# Patient Record
Sex: Male | Born: 1995 | Race: White | Hispanic: No | Marital: Single | Smoking: Former smoker
Health system: Southern US, Community
[De-identification: ages and names within clinical notes are randomized; demographics above are authoritative.]

## PROBLEM LIST (undated history)

## (undated) DIAGNOSIS — F419 Anxiety disorder, unspecified: Secondary | ICD-10-CM

## (undated) DIAGNOSIS — K219 Gastro-esophageal reflux disease without esophagitis: Secondary | ICD-10-CM

---
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
## 2015-11-11 ENCOUNTER — Emergency Department (INDEPENDENT_AMBULATORY_CARE_PROVIDER_SITE_OTHER)
Admission: EM | Admit: 2015-11-11 | Discharge: 2015-11-11 | Disposition: A | Discharge status: Home / Self Care | Payer: BLUE CROSS/BLUE SHIELD | Source: Home / Self Care | Attending: Family Medicine | Admitting: Family Medicine

## 2015-11-11 ENCOUNTER — Encounter: Payer: Self-pay | Admitting: *Deleted

## 2015-11-11 DIAGNOSIS — J029 Acute pharyngitis, unspecified: Secondary | ICD-10-CM | POA: Diagnosis not present

## 2015-11-11 LAB — POCT RAPID STREP A (OFFICE): Rapid Strep A Screen: NEGATIVE

## 2015-11-11 MED ORDER — PENICILLIN V POTASSIUM 500 MG PO TABS: ORAL_TABLET | ORAL | Status: DC

## 2015-11-11 NOTE — ED Notes (Addendum)
Pt c/o sore throat, swollen tonsils, pain and difficulty swallowing x 5 days. No fever, taken aka seltzer and tylenol otc. Girlfriend + for strep today

## 2015-11-11 NOTE — ED Provider Notes (Signed)
CSN: [id number]     Arrival date & time 11/11/15  1143 History   First MD Initiated Contact with Patient 11/11/15 1252     Chief Complaint  Patient presents with  . Sore Throat      HPI Comments: Patient developed a sore throat 5 days ago that has persisted.  He has had a mild cough but no sinus congestion, and feels hot/cold.  His girlfriend has confirmed strep pharyngitis.  The history is provided by the patient.    History reviewed. No pertinent past medical history. History reviewed. No pertinent past surgical history. History reviewed. No pertinent family history. Social History  Substance Use Topics  . Smoking status: Current Every Day Smoker -- 1.00 packs/day    Types: Cigarettes  . Smokeless tobacco: Never Used  . Alcohol Use: Yes    Review of Systems + sore throat + cough No pleuritic pain No wheezing No nasal congestion ? post-nasal drainage No sinus pain/pressure No itchy/red eyes No earache No hemoptysis No SOB No fever, + chills No nausea No vomiting No abdominal pain No diarrhea No urinary symptoms No skin rash + fatigue No myalgias No headache Used OTC meds without relief  Allergies  Review of patient's allergies indicates no known allergies.  Home Medications   Prior to Admission medications   Medication Sig Start Date End Date Taking? Authorizing Provider  penicillin v potassium (VEETID) 500 MG tablet Take one tab by mouth twice daily for 10 days 11/11/15   Lattie HawStephen A Layal Javid, MD   Meds Ordered and Administered this Visit  Medications - No data to display  BP 129/81 mmHg  Pulse 52  Temp(Src) 98.3 F (36.8 C) (Oral)  Ht 6' (1.829 m)  Wt 141 lb (63.957 kg)  BMI 19.12 kg/m2  SpO2 98% No data found.   Physical Exam Nursing notes and Vital Signs reviewed. Appearance:  Patient appears stated age, and in no acute distress Eyes:  Pupils are equal, round, and reactive to light and accomodation.  Extraocular movement is intact.  Conjunctivae  are not inflamed  Ears:  Canals normal.  Tympanic membranes normal.  Nose:  Mildly congested turbinates.  No sinus tenderness.   Pharynx:  Erythematous Neck:  Supple.  Tender enlarged tonsillar nodes bilaterally Lungs:  Clear to auscultation.  Breath sounds are equal.  Moving air well. Heart:  Regular rate and rhythm without murmurs, rubs, or gallops.  Abdomen:  Nontender without masses or hepatosplenomegaly.  Bowel sounds are present.  No CVA or flank tenderness.  Extremities:  No edema.  Skin:  No rash present.   ED Course  Procedures none    Labs Reviewed  POCT RAPID STREP A (OFFICE) negative      MDM   1. Acute pharyngitis, unspecified etiology; ?false negative rapid strep test.    Throat culture pending. Begin empiric PenVK Try warm salt water gargles for sore throat.    May take Ibuprofen 200mg , 4 tabs every 8 hours with food for sore throat.   Follow-up with family doctor if not improving about10 days.     Lattie HawStephen A Tarini Carrier, MD 11/11/15 1304

## 2015-11-11 NOTE — Discharge Instructions (Signed)
Try warm salt water gargles for sore throat.    May take Ibuprofen 200mg , 4 tabs every 8 hours with food for sore throat.   Follow-up with family doctor if not improving about10 days.    Strep Throat Strep throat is a bacterial infection of the throat. Your health care provider may call the infection tonsillitis or pharyngitis, depending on whether there is swelling in the tonsils or at the back of the throat. Strep throat is most common during the cold months of the year in children who are 435-21 years of age, but it can happen during any season in people of any age. This infection is spread from person to person (contagious) through coughing, sneezing, or close contact. CAUSES Strep throat is caused by the bacteria called Streptococcus pyogenes. RISK FACTORS This condition is more likely to develop in:  People who spend time in crowded places where the infection can spread easily.  People who have close contact with someone who has strep throat. SYMPTOMS Symptoms of this condition include:  Fever or chills.   Redness, swelling, or pain in the tonsils or throat.  Pain or difficulty when swallowing.  White or yellow spots on the tonsils or throat.  Swollen, tender glands in the neck or under the jaw.  Red rash all over the body (rare). DIAGNOSIS This condition is diagnosed by performing a rapid strep test or by taking a swab of your throat (throat culture test). Results from a rapid strep test are usually ready in a few minutes, but throat culture test results are available after one or two days. TREATMENT This condition is treated with antibiotic medicine. HOME CARE INSTRUCTIONS Medicines  Take over-the-counter and prescription medicines only as told by your health care provider.  Take your antibiotic as told by your health care provider. Do not stop taking the antibiotic even if you start to feel better.  Have family members who also have a sore throat or fever tested for  strep throat. They may need antibiotics if they have the strep infection. Eating and Drinking  Do not share food, drinking cups, or personal items that could cause the infection to spread to other people.  If swallowing is difficult, try eating soft foods until your sore throat feels better.  Drink enough fluid to keep your urine clear or pale yellow. General Instructions  Gargle with a salt-water mixture 3-4 times per day or as needed. To make a salt-water mixture, completely dissolve -1 tsp of salt in 1 cup of warm water.  Make sure that all household members wash their hands well.  Get plenty of rest.  Stay home from school or work until you have been taking antibiotics for 24 hours.  Keep all follow-up visits as told by your health care provider. This is important. SEEK MEDICAL CARE IF:  The glands in your neck continue to get bigger.  You develop a rash, cough, or earache.  You cough up a thick liquid that is green, yellow-brown, or bloody.  You have pain or discomfort that does not get better with medicine.  Your problems seem to be getting worse rather than better.  You have a fever. SEEK IMMEDIATE MEDICAL CARE IF:  You have new symptoms, such as vomiting, severe headache, stiff or painful neck, chest pain, or shortness of breath.  You have severe throat pain, drooling, or changes in your voice.  You have swelling of the neck, or the skin on the neck becomes red and tender.  You  have signs of dehydration, such as fatigue, dry mouth, and decreased urination.  You become increasingly sleepy, or you cannot wake up completely.  Your joints become red or painful.   This information is not intended to replace advice given to you by your health care provider. Make sure you discuss any questions you have with your health care provider.   Document Released: 06/05/2000 Document Revised: 02/27/2015 Document Reviewed: 10/01/2014 Elsevier Interactive Patient Education  [employer]

## 2015-11-12 ENCOUNTER — Telehealth: Payer: Self-pay | Admitting: *Deleted

## 2015-11-12 LAB — STREP A DNA PROBE: GASP: NOT DETECTED

## 2015-11-12 NOTE — ED Notes (Signed)
Callback: Pt given negative TCX results.

## 2016-03-22 ENCOUNTER — Emergency Department (INDEPENDENT_AMBULATORY_CARE_PROVIDER_SITE_OTHER)
Admission: EM | Admit: 2016-03-22 | Discharge: 2016-03-22 | Disposition: A | Discharge status: Home / Self Care | Payer: BLUE CROSS/BLUE SHIELD | Source: Home / Self Care | Attending: Family Medicine | Admitting: Family Medicine

## 2016-03-22 ENCOUNTER — Encounter: Payer: Self-pay | Admitting: Emergency Medicine

## 2016-03-22 DIAGNOSIS — Z23 Encounter for immunization: Secondary | ICD-10-CM | POA: Diagnosis not present

## 2016-03-22 DIAGNOSIS — T63461A Toxic effect of venom of wasps, accidental (unintentional), initial encounter: Secondary | ICD-10-CM

## 2016-03-22 MED ORDER — TETANUS-DIPHTH-ACELL PERTUSSIS 5-2.5-18.5 LF-MCG/0.5 IM SUSP
0.5000 mL | Freq: Once | INTRAMUSCULAR | Status: AC
  Administered 2016-03-22: 0.5 mL via INTRAMUSCULAR

## 2016-03-22 MED ORDER — CEPHALEXIN 500 MG PO CAPS
500.0000 mg | ORAL_CAPSULE | Freq: Two times a day (BID) | ORAL | 0 refills | Status: DC
Start: 2016-03-22 — End: 2016-06-25

## 2016-03-22 MED ORDER — PREDNISONE 50 MG PO TABS: ORAL_TABLET | ORAL | 0 refills | Status: DC

## 2016-03-22 NOTE — ED Triage Notes (Signed)
Yesterday patient was stung 7 times by yellow jackets: both lower legs, scalp and neck. No known allergies to stings. No known Tdap since age 20. Urged to come by family members; took benadryl last night and ibuprofen this morning.

## 2016-03-22 NOTE — ED Provider Notes (Signed)
[facility]    CSN: [id number] Arrival date & time: 03/22/16  1316     History   Chief Complaint Chief Complaint  Patient presents with  . Insect Bite    HPI Corey Peters is a 20 y.o. male.   Yesterday patient was stung 7 times by yellow jackets on his lower legs, scalp, and neck.  No shortness of breath, wheezing, or difficulty swallowing.  He has developed increasing redness and swelling at the sting sites.  His Tdap is not current.   The history is provided by the patient.    History reviewed. No pertinent past medical history.  There are no active problems to display for this patient.   History reviewed. No pertinent surgical history.     Home Medications    Prior to Admission medications   Medication Sig Start Date End Date Taking? Authorizing Provider  cephALEXin (KEFLEX) 500 MG capsule Take 1 capsule (500 mg total) by mouth 2 (two) times daily. 03/22/16   Lattie Haw, MD  penicillin v potassium (VEETID) 500 MG tablet Take one tab by mouth twice daily for 10 days 11/11/15   Lattie Haw, MD  predniSONE (DELTASONE) 50 MG tablet Take one tab by mouth with food once daily for four days 03/22/16   Lattie Haw, MD    Family History History reviewed. No pertinent family history.  Social History Social History  Substance Use Topics  . Smoking status: Current Every Day Smoker    Packs/day: 1.00    Types: Cigarettes  . Smokeless tobacco: Never Used  . Alcohol use Yes     Allergies   Review of patient's allergies indicates no known allergies.   Review of Systems Review of Systems  Constitutional: Negative for chills, diaphoresis, fatigue and fever.  HENT: Negative.   Eyes: Negative.   Respiratory: Negative.   Cardiovascular: Positive for leg swelling. Negative for chest pain.  Gastrointestinal: Negative.   Genitourinary: Negative.   Musculoskeletal: Negative.   Skin: Positive for rash.  Neurological: Negative for headaches.      Physical Exam Triage Vital Signs ED Triage Vitals  Enc Vitals Group     BP 03/22/16 1334 113/65     Pulse Rate 03/22/16 1334 (!) 56     Resp 03/22/16 1334 16     Temp 03/22/16 1334 98 F (36.7 C)     Temp Source 03/22/16 1334 Oral     SpO2 03/22/16 1334 98 %     Weight 03/22/16 1334 140 lb (63.5 kg)     Height 03/22/16 1334 6\' 1"  (1.854 m)     Head Circumference --      Peak Flow --      Pain Score 03/22/16 1336 1     Pain Loc --      Pain Edu? --      Excl. in GC? --    No data found.   Updated Vital Signs BP 113/65 (BP Location: Left Arm)   Pulse (!) 56   Temp 98 F (36.7 C) (Oral)   Resp 16   Ht 6\' 1"  (1.854 m)   Wt 140 lb (63.5 kg)   SpO2 98%   BMI 18.47 kg/m   Visual Acuity Right Eye Distance:   Left Eye Distance:   Bilateral Distance:    Right Eye Near:   Left Eye Near:    Bilateral Near:     Physical Exam  Constitutional: He appears well-developed and well-nourished. No distress.  HENT:  Head: Atraumatic.  Right Ear: External ear normal.  Left Ear: External ear normal.  Nose: Nose normal.  Mouth/Throat: Oropharynx is clear and moist.  Eyes: Conjunctivae are normal. Pupils are equal, round, and reactive to light.  Neck: Neck supple.  Cardiovascular: Normal heart sounds.   Pulmonary/Chest: Breath sounds normal.  Abdominal: There is no tenderness.  Lymphadenopathy:    He has no cervical adenopathy.  Neurological: He is alert.  Skin: There is erythema.     There are scattered insect stings on neck and lower extremities.  Right lower leg is mildly swollen and warm, but no calf tenderness present.  Nursing note and vitals reviewed.    UC Treatments / Results  Labs (all labs ordered are listed, but only abnormal results are displayed) Labs Reviewed - No data to display  EKG  EKG Interpretation None       Radiology No results found.  Procedures Procedures (including critical care time)  Medications Ordered in UC Medications   Tdap (BOOSTRIX) injection 0.5 mL (0.5 mLs Intramuscular Given 03/22/16 1337)     Initial Impression / Assessment and Plan / UC Course  I have reviewed the triage vital signs and the nursing notes.  Pertinent labs & imaging results that were available during my care of the patient were reviewed by me and considered in my medical decision making (see chart for details).  Clinical Course  Administered Tdap  Begin prednisone burst, and empiric Keflex 500mg  BID May take non-sedating antihistamine such as Zyrtec 10mg  daily for itching and rash. Followup with Family Doctor if not improved in one week.      Final Clinical Impressions(s) / UC Diagnoses   Final diagnoses:  Yellow jacket sting, accidental or unintentional, initial encounter    New Prescriptions New Prescriptions   CEPHALEXIN (KEFLEX) 500 MG CAPSULE    Take 1 capsule (500 mg total) by mouth 2 (two) times daily.   PREDNISONE (DELTASONE) 50 MG TABLET    Take one tab by mouth with food once daily for four days     Lattie HawStephen A Rogerio Boutelle, MD 03/23/16 [phone]

## 2016-03-22 NOTE — Discharge Instructions (Signed)
May take non-sedating antihistamine such as Zyrtec 10mg  daily for itching and rash.

## 2016-06-25 ENCOUNTER — Emergency Department (INDEPENDENT_AMBULATORY_CARE_PROVIDER_SITE_OTHER)
Admission: EM | Admit: 2016-06-25 | Discharge: 2016-06-25 | Disposition: A | Discharge status: Home / Self Care | Payer: BLUE CROSS/BLUE SHIELD | Source: Home / Self Care | Attending: Family Medicine | Admitting: Family Medicine

## 2016-06-25 ENCOUNTER — Encounter: Payer: Self-pay | Admitting: Emergency Medicine

## 2016-06-25 DIAGNOSIS — R197 Diarrhea, unspecified: Secondary | ICD-10-CM | POA: Diagnosis not present

## 2016-06-25 DIAGNOSIS — R109 Unspecified abdominal pain: Secondary | ICD-10-CM

## 2016-06-25 DIAGNOSIS — R112 Nausea with vomiting, unspecified: Secondary | ICD-10-CM

## 2016-06-25 MED ORDER — PROMETHAZINE HCL 25 MG PO TABS: 25.0000 mg | ORAL_TABLET | Freq: Four times a day (QID) | ORAL | 0 refills | Status: DC | PRN

## 2016-06-25 NOTE — ED Triage Notes (Signed)
Emesis started last night, diarrhea, gave 4mg  zofran and gingerale at 2:39

## 2016-06-25 NOTE — ED Provider Notes (Signed)
CSN: [id number]     Arrival date & time 06/25/16  1423 History   First MD Initiated Contact with Patient 06/25/16 1438     Chief Complaint  Patient presents with  . Emesis   (Consider location/radiation/quality/duration/timing/severity/associated sxs/prior Treatment) HPI Corey Peters is a 21 y.o. male presenting to UC with c/o nausea, vomiting, and diarrhea that started late last night.  He reports vomiting about 5-6 times and having 2-3 episodes of diarrhea.  Mild diffuse abdominal cramping.  He has not tried anything for his symptoms yet. Denies fever, chills, congestion or cough. No known sick contacts or recent travel.     History reviewed. No pertinent past medical history. History reviewed. No pertinent surgical history. No family history on file. Social History  Substance Use Topics  . Smoking status: Current Every Day Smoker    Packs/day: 1.00    Types: Cigarettes  . Smokeless tobacco: Never Used  . Alcohol use Yes    Review of Systems  Constitutional: Negative for chills and fever.  HENT: Negative for congestion, ear pain, sore throat, trouble swallowing and voice change.   Respiratory: Negative for cough and shortness of breath.   Cardiovascular: Negative for chest pain and palpitations.  Gastrointestinal: Positive for abdominal pain ( cramping), diarrhea, nausea and vomiting.  Musculoskeletal: Negative for arthralgias, back pain and myalgias.  Skin: Negative for rash.  Neurological: Negative for dizziness, syncope, light-headedness and headaches.    Allergies  Patient has no known allergies.  Home Medications   Prior to Admission medications   Medication Sig Start Date End Date Taking? Authorizing Provider  promethazine (PHENERGAN) 25 MG tablet Take 1 tablet (25 mg total) by mouth every 6 (six) hours as needed for nausea or vomiting. 06/25/16   Junius Finner, PA-C   Meds Ordered and Administered this Visit  Medications - No data to display  BP 135/79 (BP  Location: Left Arm)   Pulse (!) 57   Temp 98.2 F (36.8 C) (Oral)   Ht 5\' 11"  (1.803 m)   Wt 140 lb (63.5 kg)   SpO2 99%   BMI 19.53 kg/m  No data found.   Physical Exam  Constitutional: He is oriented to person, place, and time. He appears well-developed and well-nourished. No distress.  HENT:  Head: Normocephalic and atraumatic.  Mouth/Throat: Oropharynx is clear and moist.  Eyes: EOM are normal.  Neck: Normal range of motion.  Cardiovascular: Normal rate and regular rhythm.   Pulmonary/Chest: Effort normal and breath sounds normal. No respiratory distress. He has no wheezes. He has no rales.  Abdominal: Soft. He exhibits no distension and no mass. There is tenderness ( upper). There is no rebound, no guarding and no CVA tenderness.  Musculoskeletal: Normal range of motion.  Neurological: He is alert and oriented to person, place, and time.  Skin: Skin is warm and dry. He is not diaphoretic.  Psychiatric: He has a normal mood and affect. His behavior is normal.  Nursing note and vitals reviewed.   Urgent Care Course   Clinical Course     Procedures (including critical care time)  Labs Review Labs Reviewed - No data to display  Imaging Review No results found.   MDM   1. Nausea vomiting and diarrhea   2. Abdominal cramping    Pt c/o sudden onset GI symptoms since last night. Moist mucous membranes. Zofran 4mg  ODT given in UC Able to keep down several ounces of clear soda in UC.  Symptoms likely viral in nature.  Rx: Phenergan Encouraged fluids and rest. Pt info packet provided for home care. F/u with PCP In 3-4 days if not improving. Discussed symptoms that warrant emergent care in the ED.     Junius Finnerrin O'Malley, PA-C 06/25/16 [id number]

## 2016-10-27 ENCOUNTER — Ambulatory Visit (INDEPENDENT_AMBULATORY_CARE_PROVIDER_SITE_OTHER): Payer: BLUE CROSS/BLUE SHIELD

## 2016-10-27 ENCOUNTER — Ambulatory Visit (INDEPENDENT_AMBULATORY_CARE_PROVIDER_SITE_OTHER): Payer: BLUE CROSS/BLUE SHIELD | Admitting: Family Medicine

## 2016-10-27 ENCOUNTER — Encounter: Payer: Self-pay | Admitting: Family Medicine

## 2016-10-27 VITALS — BP 132/72 | HR 80 | Ht 71.5 in | Wt 144.3 lb

## 2016-10-27 DIAGNOSIS — M79631 Pain in right forearm: Secondary | ICD-10-CM

## 2016-10-27 DIAGNOSIS — S59911A Unspecified injury of right forearm, initial encounter: Secondary | ICD-10-CM

## 2016-10-27 MED ORDER — CEFDINIR 300 MG PO CAPS: 300.0000 mg | ORAL_CAPSULE | Freq: Two times a day (BID) | ORAL | 0 refills | Status: DC

## 2016-10-27 MED ORDER — TRAMADOL HCL 50 MG PO TABS: 50.0000 mg | ORAL_TABLET | Freq: Three times a day (TID) | ORAL | 0 refills | Status: DC | PRN

## 2016-10-27 MED ORDER — DOXYCYCLINE HYCLATE 100 MG PO TABS: 100.0000 mg | ORAL_TABLET | Freq: Two times a day (BID) | ORAL | 0 refills | Status: DC
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
## 2016-10-27 NOTE — Patient Instructions (Addendum)
Thank you for coming in today. Take the antibiotics I sent the pharmacy.  Take 2 aleve twice daily.  Use tramadol sparingly for severe pain.  Recheck in 2-3 days.  Return sooner if needed or if worse.  Call me if you are having a problem.  [phone]  Use the wrist as needed.    Contusion A contusion is a deep bruise. Contusions happen when an injury causes bleeding under the skin. Symptoms of bruising include pain, swelling, and discolored skin. The skin may turn blue, purple, or yellow. Follow these instructions at home:  Rest the injured area.  If told, put ice on the injured area.  Put ice in a plastic bag.  Place a towel between your skin and the bag.  Leave the ice on for 20 minutes, 2-3 times per day.  If told, put light pressure (compression) on the injured area using an elastic bandage. Make sure the bandage is not too tight. Remove it and put it back on as told by your doctor.  If possible, raise (elevate) the injured area above the level of your heart while you are sitting or lying down.  Take over-the-counter and prescription medicines only as told by your doctor. Contact a doctor if:  Your symptoms do not get better after several days of treatment.  Your symptoms get worse.  You have trouble moving the injured area. Get help right away if:  You have very bad pain.  You have a loss of feeling (numbness) in a hand or foot.  Your hand or foot turns pale or cold. This information is not intended to replace advice given to you by your health care provider. Make sure you discuss any questions you have with your health care provider. Document Released: 11/25/2007 Document Revised: 11/14/2015 Document Reviewed: 10/24/2014 Elsevier Interactive Patient Education  2017 [employer] Inc[employer]    Cellulitis, Adult Cellulitis is a skin infection. The infected area is usually red and sore. This condition occurs most often in the arms and lower legs. It is very important to  get treated for this condition. Follow these instructions at home:  Take over-the-counter and prescription medicines only as told by your doctor.  If you were prescribed an antibiotic medicine, take it as told by your doctor. Do not stop taking the antibiotic even if you start to feel better.  Drink enough fluid to keep your pee (urine) clear or pale yellow.  Do not touch or rub the infected area.  Raise (elevate) the infected area above the level of your heart while you are sitting or lying down.  Place warm or cold wet cloths (warm or cold compresses) on the infected area. Do this as told by your doctor.  Keep all follow-up visits as told by your doctor. This is important. These visits let your doctor make sure your infection is not getting worse. Contact a doctor if:  You have a fever.  Your symptoms do not get better after 1-2 days of treatment.  Your bone or joint under the infected area starts to hurt after the skin has healed.  Your infection comes back. This can happen in the same area or another area.  You have a swollen bump in the infected area.  You have new symptoms.  You feel ill and also have muscle aches and pains. Get help right away if:  Your symptoms get worse.  You feel very sleepy.  You throw up (vomit) or have watery poop (diarrhea) for a long time.  There are red streaks coming from the infected area.  Your red area gets larger.  Your red area turns darker. This information is not intended to replace advice given to you by your health care provider. Make sure you discuss any questions you have with your health care provider. Document Released: 11/25/2007 Document Revised: 11/14/2015 Document Reviewed: 04/17/2015 Elsevier Interactive Patient Education  2017 [employer] Inc.    Acute Compartment Syndrome Compartment syndrome is a painful condition that occurs when swelling and pressure build up in a body space (compartment) of the arms or legs.  Groups of muscles, nerves, and blood vessels in the arms and legs are separated into various compartments. Each compartment is surrounded by tough layers of tissue (fascia). In compartment syndrome, pressure builds up within the layers of fascia and begins to push on the structures within that compartment. In acute compartment syndrome, the pressure builds up suddenly, often as the result of an injury. If pressure continues to increase, it can block the flow of blood in the smallest blood vessels (capillaries). Then the muscles in the compartment cannot get enough oxygen and nutrients and will start to die within 4-6 hours. The nerves will begin to die within 12-24 hours. This condition is a medical emergency that must be treated with surgery. What are the causes? This condition may be caused by:  Injury. Some injuries can cause swelling or bleeding in a compartment. This can lead to compartment syndrome. Injuries that may cause this problem include:  Broken bones, especially the long bones of the arms and legs.  Crushing injuries.  Penetrating injuries, such as a knife wound.  Badly bruised muscles.  Poisonous bites, such as a snake bite.  Severe burns.  Blocked blood flow. This could be a result of:  A cast or bandage that is too tight.  A surgical procedure. Blood flow sometimes has to be stopped for a while during a surgery, usually with a tourniquet.  Lying for too long in a position that restricts blood flow. This can happen in people who have nerve damage or if a person is unconscious for a long time.  Medicines used to build up muscles (anabolic steroids).  Medicines that keep the blood from forming clots (blood thinners). What are the signs or symptoms? The most common symptom of this condition is pain. The pain:  May be far more severe than it should be for the injury you have.  May get worse:  When moving or stretching the affected body part.  When the area is pushed  or squeezed.  When raising (elevating) affected body part above the level of the heart.  May come with a feeling of tingling or burning.  May not get better when you take pain medicine. Other symptoms include:  A feeling of tightness or fullness in the affected area.  A loss of feeling.  Weakness in the area.  Loss of movement.  Skin becoming pale, tight, and shiny over the painful area.  Warmth and tenderness.  Tensing when the affected area is touched. How is this diagnosed? This condition may be diagnosed based on:  Your physical exam and symptoms.  Measuring the pressure in the affected area (compartment pressure measurement).  Tests to rule out other problems, such as:  X-rays.  Blood tests.  Ultrasound. How is this treated? Treatment for this condition uses a procedure called fasciotomy. In this procedure, incisions are made through the fascia to relieve the pressure in the compartment and to prevent permanent  damage. Before the surgery, first-aid treatment is done, which may include:  Treating any injury.  Loosening or removing any cast, bandage, or external wrap that may be causing pain.  Elevating the painful arm or leg to the same level as the heart.  Giving oxygen.  Giving fluids through an IV tube.  Pain medicine. Summary  Compartment syndrome occurs when swelling and pressure build up in a body space (compartment) of the arms or legs.  First aid treatment may include loosening or removing a cast, bandage, or wrap and elevating the painful arm or leg at the level of the heart.  In acute compartment syndrome, the pressure builds up suddenly, often as the result of an injury.  This condition is a medical emergency that must be treated with a surgical procedure called fasciotomy. This procedure relieves the pressure and prevents permanent damage. This information is not intended to replace advice given to you by your health care provider. Make sure  you discuss any questions you have with your health care provider. Document Released: 05/27/2009 Document Revised: 05/28/2016 Document Reviewed: 05/28/2016 Elsevier Interactive Patient Education  2017 [employer]

## 2017-06-18 ENCOUNTER — Encounter: Payer: Self-pay | Admitting: Emergency Medicine

## 2017-06-18 ENCOUNTER — Emergency Department (INDEPENDENT_AMBULATORY_CARE_PROVIDER_SITE_OTHER)
Admission: EM | Admit: 2017-06-18 | Discharge: 2017-06-18 | Disposition: A | Discharge status: Home / Self Care | Payer: BLUE CROSS/BLUE SHIELD | Source: Home / Self Care | Attending: Family Medicine | Admitting: Family Medicine

## 2017-06-18 ENCOUNTER — Other Ambulatory Visit: Payer: Self-pay

## 2017-06-18 DIAGNOSIS — R1031 Right lower quadrant pain: Secondary | ICD-10-CM

## 2017-06-18 DIAGNOSIS — R590 Localized enlarged lymph nodes: Secondary | ICD-10-CM

## 2017-06-18 NOTE — ED Triage Notes (Signed)
Right side inguinal hernia x 5 days 2-10 pain

## 2017-06-18 NOTE — Discharge Instructions (Signed)
° °  You may take 500mg  acetaminophen every 4-6 hours or in combination with ibuprofen 400-600mg  every 6-8 hours as needed for pain and inflammation.  You may also try alternating cool and warm compresses for comfort.

## 2017-06-18 NOTE — ED Provider Notes (Signed)
[facility]    CSN: [id number] Arrival date & time: 06/18/17  1257     History   Chief Complaint Chief Complaint  Patient presents with  . Adenopathy    HPI Marco CollieWilliam Vanamburg is a 21 y.o. male.   HPI Marco CollieWilliam Augustine is a 21 y.o. male presenting to UC with c/o Right side groin pain that he describes as a small nodule that is a little tender. He noticed it about 5 days ago. Pain is 2/10. Touching it makes it worse. 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 Denies difficulty urinating, hematuria or pain with urination. No hx of hernias in the past. He has not tried anything for the pain.    History reviewed. No pertinent past medical history.  There are no active problems to display for this patient.   History reviewed. No pertinent surgical history.     Home Medications    Prior to Admission medications   Not on File    Family History No family history on file.  Social History Social History   Tobacco Use  . Smoking status: Current Every Day Smoker    Packs/day: 0.25    Types: Cigarettes  . Smokeless tobacco: Never Used  Substance Use Topics  . Alcohol use: Yes  . Drug use: Yes     Allergies   Patient has no known allergies.   Review of Systems Review of Systems  Gastrointestinal: Negative for nausea and vomiting.  Genitourinary: Negative for decreased urine volume, dysuria, flank pain, frequency, hematuria, scrotal swelling, testicular pain and urgency.  Musculoskeletal: Negative for back pain.  Skin: Negative for rash.     Physical Exam Triage Vital Signs ED Triage Vitals [06/18/17 1325]  Enc Vitals Group     BP 137/80     Pulse Rate (!) 58     Resp      Temp 98.2 F (36.8 C)     Temp Source Oral     SpO2 99 %     Weight 141 lb (64 kg)     Height 5' 11.5" (1.816 m)     Head Circumference      Peak Flow      Pain Score      Pain Loc      Pain Edu?      Excl. in [place]?    No data  found.  Updated Vital Signs BP 137/80 (BP Location: Right Arm)   Pulse (!) 58   Temp 98.2 F (36.8 C) (Oral)   Ht 5' 11.5" (1.816 m)   Wt 141 lb (64 kg)   SpO2 99%   BMI 19.39 kg/m   Visual Acuity Right Eye Distance:   Left Eye Distance:   Bilateral Distance:    Right Eye Near:   Left Eye Near:    Bilateral Near:     Physical Exam  Constitutional: He is oriented to person, place, and time. He appears well-developed and well-nourished. No distress.  HENT:  Head: Normocephalic and atraumatic.  Eyes: EOM are normal.  Neck: Normal range of motion.  Cardiovascular: Normal rate.  Pulmonary/Chest: Effort normal.  Genitourinary:  Genitourinary Comments: Chaperoned exam. Right groin: pea-sized mobile, mildly tender nodule c/w lymph node. Skin in tact. No erythema or ecchymosis.   Musculoskeletal: Normal range of motion.  Neurological: He is alert and oriented to person, place, and time.  Skin: Skin is warm and dry. No rash noted. He is  not diaphoretic. No erythema.  Psychiatric: He has a normal mood and affect. His behavior is normal.  Nursing note and vitals reviewed.    UC Treatments / Results  Labs (all labs ordered are listed, but only abnormal results are displayed) Labs Reviewed - No data to display  EKG  EKG Interpretation None       Radiology No results found.  Procedures Procedures (including critical care time)  Medications Ordered in UC Medications - No data to display   Initial Impression / Assessment and Plan / UC Course  I have reviewed the triage vital signs and the nursing notes.  Pertinent labs & imaging results that were available during my care of the patient were reviewed by me and considered in my medical decision making (see chart for details).     Hx and exam c/w lymphadenopathy  Home care instructions provided F/u with PCP in 1 week if not improving, sooner if worsening.   Final Clinical Impressions(s) / UC Diagnoses   Final  diagnoses:  Inguinal lymphadenopathy  Right groin pain    ED Discharge Orders    None       Controlled Substance Prescriptions [place] Controlled Substance Registry consulted? Not Applicable   Rolla Platehelps, Reniyah Gootee O, PA-C 06/18/17 [id number]
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
## 2019-03-15 ENCOUNTER — Emergency Department (INDEPENDENT_AMBULATORY_CARE_PROVIDER_SITE_OTHER)
Admission: EM | Admit: 2019-03-15 | Discharge: 2019-03-15 | Disposition: A | Discharge status: Home / Self Care | Payer: BC Managed Care – PPO | Source: Home / Self Care

## 2019-03-15 ENCOUNTER — Encounter: Payer: Self-pay | Admitting: *Deleted

## 2019-03-15 ENCOUNTER — Other Ambulatory Visit: Payer: Self-pay

## 2019-03-15 DIAGNOSIS — R1084 Generalized abdominal pain: Secondary | ICD-10-CM

## 2019-03-15 DIAGNOSIS — R197 Diarrhea, unspecified: Secondary | ICD-10-CM | POA: Diagnosis not present

## 2019-03-15 DIAGNOSIS — R112 Nausea with vomiting, unspecified: Secondary | ICD-10-CM | POA: Diagnosis not present

## 2019-03-15 DIAGNOSIS — Z789 Other specified health status: Secondary | ICD-10-CM

## 2019-03-15 DIAGNOSIS — Z7289 Other problems related to lifestyle: Secondary | ICD-10-CM

## 2019-03-15 HISTORY — DX: Anxiety disorder, unspecified: F41.9

## 2019-03-15 LAB — POCT CBC W AUTO DIFF (K'VILLE URGENT CARE)

## 2019-03-15 MED ORDER — ONDANSETRON 4 MG PO TBDP: 4.0000 mg | ORAL_TABLET | Freq: Three times a day (TID) | ORAL | 0 refills | Status: DC | PRN

## 2019-03-15 NOTE — Discharge Instructions (Addendum)
°  No Primary Care Doctor: Call [facility] at  [phone] - they can help you locate a primary care doctor that  accepts your insurance, provides certain services, etc. Physician Referral Service- [phone]  Be sure to get a lot of rest and stay well hydrated with sports drinks, water, diluted juices, and clear sodas.  Avoid fried fatty food, spicy food, and milk as these foods can cause worsening stomach upset.   Please follow up with family medicine in 2-3 days if not improving.  Call 911 or go to the hospital if symptoms worsening- worsening abdominal pain, unable to keep down fluids, blood in vomit or stool, dizziness/passing out, or other new concerning symptoms develop.

## 2019-03-15 NOTE — ED Provider Notes (Signed)
[facility]    CSN: [id number] Arrival date & time: 03/15/19  1606      History   Chief Complaint Chief Complaint  Patient presents with  . Headache  . Nausea  . Diarrhea    HPI Corey Peters is a 23 y.o. male.   HPI  Corey Peters is a 23 y.o. male presenting to UC with c/o n/v/d that started yesterday he had about 2 episodes of vomiting yesterday with mild generalized abdominal cramping and 3 episodes of loose stools today.  Denies blood or mucous in his stool. Mild generalized HA today but notes he has not had anything to eat since a taco yesterday. He has been able to keep down fluids. He has been more stressed recently and notes he drinks 8-10 beers a day. Symptoms worsen when he does not drink. Denies fever, chills, cough, congestion, recent travel or sick contacts. Pt notes he feels better today than he did yesterday but needs a work note for yesterday and today. He does not have a PCP.   Past Medical History:  Diagnosis Date  . Anxiety     There are no active problems to display for this patient.   History reviewed. No pertinent surgical history.     Home Medications    Prior to Admission medications   Medication Sig Start Date End Date Taking? Authorizing Provider  ondansetron (ZOFRAN ODT) 4 MG disintegrating tablet Take 1 tablet (4 mg total) by mouth every 8 (eight) hours as needed. 03/15/19   Noe Gens, PA-C    Family History Family History  Problem Relation Age of Onset  . Asthma Mother     Social History Social History   Tobacco Use  . Smoking status: Current Every Day Smoker    Packs/day: 0.50    Types: Cigarettes  . Smokeless tobacco: Never Used  Substance Use Topics  . Alcohol use: Yes    Comment: 8-10 beers per day  . Drug use: Not Currently     Allergies   Patient has no known allergies.   Review of Systems Review of Systems  Constitutional: Negative for chills and fever.  HENT: Negative for congestion, ear pain,  sore throat, trouble swallowing and voice change.   Respiratory: Negative for cough and shortness of breath.   Cardiovascular: Negative for chest pain and palpitations.  Gastrointestinal: Positive for abdominal pain, diarrhea, nausea and vomiting. Negative for blood in stool.  Musculoskeletal: Negative for arthralgias, back pain and myalgias.  Skin: Negative for rash.  Neurological: Positive for headaches. Negative for dizziness and light-headedness.     Physical Exam Triage Vital Signs ED Triage Vitals [03/15/19 1629]  Enc Vitals Group     BP 104/66     Pulse Rate 71     Resp 16     Temp 98.1 F (36.7 C)     Temp Source Oral     SpO2 98 %     Weight 154 lb (69.9 kg)     Height 6\' 1"  (1.854 m)     Head Circumference      Peak Flow      Pain Score 0     Pain Loc      Pain Edu?      Excl. in [place]?    No data found.  Updated Vital Signs BP 104/66 (BP Location: Right Arm)   Pulse 71   Temp 98.1 F (36.7 C) (Oral)   Resp 16   Ht 6\' 1"  (1.854  m)   Wt 154 lb (69.9 kg)   SpO2 98%   BMI 20.32 kg/m   Visual Acuity Right Eye Distance:   Left Eye Distance:   Bilateral Distance:    Right Eye Near:   Left Eye Near:    Bilateral Near:     Physical Exam Vitals signs and nursing note reviewed.  Constitutional:      General: He is not in acute distress.    Appearance: He is well-developed. He is not ill-appearing, toxic-appearing or diaphoretic.  HENT:     Head: Normocephalic and atraumatic.     Mouth/Throat:     Mouth: Mucous membranes are moist.     Pharynx: Oropharynx is clear.  Neck:     Musculoskeletal: Normal range of motion.  Cardiovascular:     Rate and Rhythm: Normal rate and regular rhythm.  Pulmonary:     Effort: Pulmonary effort is normal.     Breath sounds: Normal breath sounds.  Abdominal:     General: There is no distension.     Palpations: Abdomen is soft.     Tenderness: There is no abdominal tenderness.  Musculoskeletal: Normal range of motion.   Skin:    General: Skin is warm and dry.  Neurological:     Mental Status: He is alert and oriented to person, place, and time.  Psychiatric:        Behavior: Behavior normal.      UC Treatments / Results  Labs (all labs ordered are listed, but only abnormal results are displayed) Labs Reviewed  COMPLETE METABOLIC PANEL WITH GFR  LIPASE  POCT CBC W AUTO DIFF ([facility])    EKG   Radiology No results found.  Procedures Procedures (including critical care time)  Medications Ordered in UC Medications - No data to display  Initial Impression / Assessment and Plan / UC Course  I have reviewed the triage vital signs and the nursing notes.  Pertinent labs & imaging results that were available during my care of the patient were reviewed by me and considered in my medical decision making (see chart for details).     Benign abdominal exam. Vitals: WNL Despite benign abdominal exam, labs sent due to reports of daily alcohol consumption, about 8-10 beers daily. Pt does not have a PCP for quick f/u.  Discussed symptoms that warrant emergent care in the ED. AVS and work note provided.  Final Clinical Impressions(s) / UC Diagnoses   Final diagnoses:  Nausea vomiting and diarrhea  Abdominal cramping, generalized  Alcohol use     Discharge Instructions      No Primary Care Doctor: - Call [facility] at  [phone] - they can help you locate a primary care doctor that  accepts your insurance, provides certain services, etc. - Physician Referral Service- [phone]  Be sure to get a lot of rest and stay well hydrated with sports drinks, water, diluted juices, and clear sodas.  Avoid fried fatty food, spicy food, and milk as these foods can cause worsening stomach upset.   Please follow up with family medicine in 2-3 days if not improving.  Call 911 or go to the hospital if symptoms worsening- worsening abdominal pain, unable to keep down fluids, blood in  vomit or stool, dizziness/passing out, or other new concerning symptoms develop.     ED Prescriptions    Medication Sig Dispense Auth. Provider   ondansetron (ZOFRAN ODT) 4 MG disintegrating tablet Take 1 tablet (4 mg total) by mouth  every 8 (eight) hours as needed. 8 tablet Lurene Shadow, [place]     PDMP not reviewed this encounter.   Lurene Shadow, [place] 03/15/19 1755

## 2019-03-15 NOTE — ED Triage Notes (Signed)
Pt c/o diarrhea, nausea and slight HA x 2 days. Denies fever, cough or SOB. Hx of Anxiety.

## 2019-03-16 LAB — COMPLETE METABOLIC PANEL WITH GFR
AG Ratio: 2 (calc) (ref 1.0–2.5)
ALT: 11 U/L (ref 9–46)
AST: 16 U/L (ref 10–40)
Albumin: 4.9 g/dL (ref 3.6–5.1)
Alkaline phosphatase (APISO): 126 U/L (ref 36–130)
BUN: 11 mg/dL (ref 7–25)
CO2: 25 mmol/L (ref 20–32)
Calcium: 10.1 mg/dL (ref 8.6–10.3)
Chloride: 105 mmol/L (ref 98–110)
Creat: 1.03 mg/dL (ref 0.60–1.35)
GFR, Est African American: 118 mL/min/{1.73_m2} (ref >=60)
GFR, Est Non African American: 102 mL/min/{1.73_m2} (ref >=60)
Globulin: 2.5 g/dL (calc) (ref 1.9–3.7)
Glucose, Bld: 87 mg/dL (ref 65–99)
Potassium: 4.5 mmol/L (ref 3.5–5.3)
Sodium: 140 mmol/L (ref 135–146)
Total Bilirubin: 0.8 mg/dL (ref 0.2–1.2)
Total Protein: 7.4 g/dL (ref 6.1–8.1)

## 2019-03-16 LAB — LIPASE: Lipase: 13 U/L (ref 7–60)

## 2019-08-21 DIAGNOSIS — U071 COVID-19: Secondary | ICD-10-CM

## 2019-08-21 HISTORY — DX: COVID-19: U07.1

## 2020-06-10 ENCOUNTER — Other Ambulatory Visit: Payer: Self-pay

## 2020-06-10 ENCOUNTER — Emergency Department (INDEPENDENT_AMBULATORY_CARE_PROVIDER_SITE_OTHER)
Admission: EM | Admit: 2020-06-10 | Discharge: 2020-06-10 | Disposition: A | Discharge status: Home / Self Care | Payer: BC Managed Care – PPO | Source: Home / Self Care

## 2020-06-10 ENCOUNTER — Encounter: Payer: Self-pay | Admitting: Emergency Medicine

## 2020-06-10 DIAGNOSIS — R52 Pain, unspecified: Secondary | ICD-10-CM

## 2020-06-10 DIAGNOSIS — J069 Acute upper respiratory infection, unspecified: Secondary | ICD-10-CM

## 2020-06-10 MED ORDER — BENZONATATE 100 MG PO CAPS: 100.0000 mg | ORAL_CAPSULE | Freq: Three times a day (TID) | ORAL | 0 refills | Status: DC

## 2020-06-10 NOTE — ED Provider Notes (Signed)
[facility]    CSN: [id number] Arrival date & time: 06/10/20  1301      History   Chief Complaint Chief Complaint  Patient presents with  . Fatigue  . Cough    HPI Corey Peters is a 24 y.o. male.   HPI Corey Peters is a 24 y.o. male presenting to UC with c/o 3 days of body aches, chills, nasal congestion, and cough. Minimal relief with OTC cold & flu.  Denies n/v/d. No chest pain or SOB. No known sick contacts. Had COVID in March 2021. Has not been vaccinated for COVID or flu.    Past Medical History:  Diagnosis Date  . Anxiety   . COVID-19 08/2019    There are no problems to display for this patient.   History reviewed. No pertinent surgical history.     Home Medications    Prior to Admission medications   Medication Sig Start Date End Date Taking? Authorizing Provider  benzonatate (TESSALON) 100 MG capsule Take 1 capsule (100 mg total) by mouth every 8 (eight) hours. 06/10/20   Lurene Shadow, PA-C  ondansetron (ZOFRAN ODT) 4 MG disintegrating tablet Take 1 tablet (4 mg total) by mouth every 8 (eight) hours as needed. Patient not taking: Reported on 06/10/2020 03/15/19   Rolla Plate    Family History Family History  Problem Relation Age of Onset  . Asthma Mother   . Healthy Father   . Healthy Sister     Social History Social History   Tobacco Use  . Smoking status: Former Smoker    Packs/day: 0.00    Quit date: 2020    Years since quitting: 1.9  . Smokeless tobacco: Never Used  Vaping Use  . Vaping Use: Never used  Substance Use Topics  . Alcohol use: Yes    Alcohol/week: 14.0 standard drinks    Types: 14 Cans of beer per week  . Drug use: Not Currently     Allergies   Patient has no known allergies.   Review of Systems Review of Systems  Constitutional: Negative for chills and fever.  HENT: Positive for congestion and rhinorrhea. Negative for ear pain, sore throat, trouble swallowing and voice change.    Respiratory: Positive for cough. Negative for shortness of breath.   Cardiovascular: Negative for chest pain and palpitations.  Gastrointestinal: Negative for abdominal pain, diarrhea, nausea and vomiting.  Musculoskeletal: Negative for arthralgias, back pain and myalgias.  Skin: Negative for rash.  All other systems reviewed and are negative.    Physical Exam Triage Vital Signs ED Triage Vitals  Enc Vitals Group     BP 06/10/20 1320 139/90     Pulse Rate 06/10/20 1320 65     Resp 06/10/20 1320 17     Temp 06/10/20 1320 98.7 F (37.1 C)     Temp src --      SpO2 06/10/20 1320 99 %     Weight 06/10/20 1325 162 lb (73.5 kg)     Height 06/10/20 1325 6\' 1"  (1.854 m)     Head Circumference --      Peak Flow --      Pain Score 06/10/20 1324 3     Pain Loc --      Pain Edu? --      Excl. in GC? --    No data found.  Updated Vital Signs BP 139/90 (BP Location: Right Arm)   Pulse 65   Temp 98.7 F (  37.1 C)   Resp 17   Ht 6\' 1"  (1.854 m)   Wt 162 lb (73.5 kg)   SpO2 99%   BMI 21.37 kg/m   Visual Acuity Right Eye Distance:   Left Eye Distance:   Bilateral Distance:    Right Eye Near:   Left Eye Near:    Bilateral Near:     Physical Exam Vitals and nursing note reviewed.  Constitutional:      Appearance: Normal appearance. He is well-developed and well-nourished.  HENT:     Head: Normocephalic and atraumatic.     Right Ear: Tympanic membrane and ear canal normal.     Left Ear: Tympanic membrane and ear canal normal.     Nose: Congestion present.     Right Sinus: No maxillary sinus tenderness or frontal sinus tenderness.     Left Sinus: No maxillary sinus tenderness or frontal sinus tenderness.     Mouth/Throat:     Lips: Pink.     Mouth: Mucous membranes are moist.     Pharynx: Oropharynx is clear. Uvula midline. No pharyngeal swelling, oropharyngeal exudate, posterior oropharyngeal erythema or uvula swelling.  Eyes:     Extraocular Movements: EOM normal.   Cardiovascular:     Rate and Rhythm: Normal rate and regular rhythm.  Pulmonary:     Effort: Pulmonary effort is normal. No respiratory distress.     Breath sounds: Normal breath sounds. No stridor. No wheezing, rhonchi or rales.  Musculoskeletal:        General: Normal range of motion.     Cervical back: Normal range of motion and neck supple. No tenderness.  Lymphadenopathy:     Cervical: No cervical adenopathy.  Skin:    General: Skin is warm and dry.  Neurological:     Mental Status: He is alert and oriented to person, place, and time.  Psychiatric:        Mood and Affect: Mood and affect normal.        Behavior: Behavior normal.      UC Treatments / Results  Labs (all labs ordered are listed, but only abnormal results are displayed) Labs Reviewed  COVID-19, FLU A+B NAA    EKG   Radiology No results found.  Procedures Procedures (including critical care time)  Medications Ordered in UC Medications - No data to display  Initial Impression / Assessment and Plan / UC Course  I have reviewed the triage vital signs and the nursing notes.  Pertinent labs & imaging results that were available during my care of the patient were reviewed by me and considered in my medical decision making (see chart for details).    No evidence of bacterial infection at this time. COVID/Flu/RSV pending Encouraged symptomatic tx F/u with PCP as needed AVS given  Final Clinical Impressions(s) / UC Diagnoses   Final diagnoses:  Generalized body aches  Viral URI with cough     Discharge Instructions      You may take 500mg  acetaminophen every 4-6 hours or in combination with ibuprofen 400-600mg  every 6-8 hours as needed for pain, inflammation, and fever.  Be sure to well hydrated with clear liquids and get at least 8 hours of sleep at night, preferably more while sick.   Please follow up with family medicine in 1 week if needed.      ED Prescriptions    Medication Sig  Dispense Auth. Provider   benzonatate (TESSALON) 100 MG capsule Take 1 capsule (100 mg total) by mouth  every 8 (eight) hours. 21 capsule Lurene Shadow, [place]     PDMP not reviewed this encounter.   Lurene Shadow, [place] 06/10/20 1341

## 2020-06-10 NOTE — ED Triage Notes (Signed)
Cough & fatigue since Friday  Generalized body aches OTC - no meds until last night (generic cold & flu) No meds today  Denies fever  No COVID or Flu vaccine

## 2020-06-10 NOTE — Discharge Instructions (Signed)
  You may take 500mg acetaminophen every 4-6 hours or in combination with ibuprofen 400-600mg every 6-8 hours as needed for pain, inflammation, and fever.  Be sure to well hydrated with clear liquids and get at least 8 hours of sleep at night, preferably more while sick.   Please follow up with family medicine in 1 week if needed.   

## 2020-06-12 LAB — COVID-19, FLU A+B NAA
Influenza A, NAA: NOT DETECTED
Influenza B, NAA: NOT DETECTED
SARS-CoV-2, NAA: NOT DETECTED

## 2020-09-16 ENCOUNTER — Emergency Department (INDEPENDENT_AMBULATORY_CARE_PROVIDER_SITE_OTHER)
Admission: EM | Admit: 2020-09-16 | Discharge: 2020-09-16 | Disposition: A | Discharge status: Home / Self Care | Payer: BC Managed Care – PPO | Source: Home / Self Care

## 2020-09-16 ENCOUNTER — Other Ambulatory Visit: Payer: Self-pay

## 2020-09-16 ENCOUNTER — Encounter: Payer: Self-pay | Admitting: Emergency Medicine

## 2020-09-16 DIAGNOSIS — F411 Generalized anxiety disorder: Secondary | ICD-10-CM | POA: Diagnosis not present

## 2020-09-16 DIAGNOSIS — R222 Localized swelling, mass and lump, trunk: Secondary | ICD-10-CM | POA: Diagnosis not present

## 2020-09-16 DIAGNOSIS — K2100 Gastro-esophageal reflux disease with esophagitis, without bleeding: Secondary | ICD-10-CM | POA: Diagnosis not present

## 2020-09-16 DIAGNOSIS — R1013 Epigastric pain: Secondary | ICD-10-CM | POA: Diagnosis not present

## 2020-09-16 MED ORDER — OMEPRAZOLE 20 MG PO CPDR: 20.0000 mg | DELAYED_RELEASE_CAPSULE | Freq: Every day | ORAL | 0 refills | Status: DC

## 2020-09-16 NOTE — ED Provider Notes (Addendum)
[facility]    CSN: [id number] Arrival date & time: 09/16/20  1852      History   Chief Complaint Chief Complaint  Patient presents with  . Abdominal Pain    HPI Corey Peters is a 25 y.o. male.   Reports epigastric pain that has been gradually worsening over the last 2 weeks. Reports that he has hx GERD and has not take medications for this in about a year. Reports that he has anxiety as well. Does not have primary care and is not being treated for his anxiety. Has had similar chest pain in the past with drug use, alcohol use and anxiety. Reports that his left arm has been sore as well. Denies known injury. Has not attempted OTC treatment. Reports that he drank alcohol and vomited over the weekend. He is concerned that this is contributing to his symptoms. Reports that he has a knot on the L side of his chest that has been there for the last few weeks, denies known injury. Denies pain in this area. Has not previously had the nodule evaluated. Denies hematemesis, cough, SOB, fatigue, weakness, radiating pain, fever, rash, other symptoms.  ROS per HPI   The history is provided by the patient.  Abdominal Pain   Past Medical History:  Diagnosis Date  . Anxiety   . COVID-19 08/2019    There are no problems to display for this patient.   History reviewed. No pertinent surgical history.     Home Medications    Prior to Admission medications   Medication Sig Start Date End Date Taking? Authorizing Provider  omeprazole (PRILOSEC) 20 MG capsule Take 1 capsule (20 mg total) by mouth daily. 09/16/20  Yes Moshe Cipro, NP    Family History Family History  Problem Relation Age of Onset  . Asthma Mother   . Healthy Father   . Healthy Sister     Social History Social History   Tobacco Use  . Smoking status: Former Smoker    Packs/day: 0.00    Quit date: 2020    Years since quitting: 2.2  . Smokeless tobacco: Never Used  Vaping Use  . Vaping Use:  Never used  Substance Use Topics  . Alcohol use: Yes    Alcohol/week: 14.0 standard drinks    Types: 14 Cans of beer per week  . Drug use: Not Currently     Allergies   Patient has no known allergies.   Review of Systems Review of Systems  Gastrointestinal: Positive for abdominal pain.     Physical Exam Triage Vital Signs ED Triage Vitals  Enc Vitals Group     BP 09/16/20 1902 131/78     Pulse Rate 09/16/20 1902 64     Resp 09/16/20 1902 18     Temp 09/16/20 1902 98.1 F (36.7 C)     Temp Source 09/16/20 1902 Oral     SpO2 09/16/20 1902 100 %     Weight 09/16/20 1903 160 lb (72.6 kg)     Height 09/16/20 1903 6' (1.829 m)     Head Circumference --      Peak Flow --      Pain Score 09/16/20 1903 6     Pain Loc --      Pain Edu? --      Excl. in GC? --    No data found.  Updated Vital Signs BP 131/78 (BP Location: Left Arm)   Pulse 64   Temp 98.1 F (  36.7 C) (Oral)   Resp 18   Ht 6' (1.829 m)   Wt 160 lb (72.6 kg)   SpO2 100%   BMI 21.70 kg/m      Physical Exam Vitals and nursing note reviewed.  Constitutional:      General: He is not in acute distress.    Appearance: He is well-developed. He is not ill-appearing.  HENT:     Head: Normocephalic and atraumatic.     Mouth/Throat:     Mouth: Mucous membranes are moist.     Pharynx: Oropharynx is clear.  Eyes:     Extraocular Movements: Extraocular movements intact.     Conjunctiva/sclera: Conjunctivae normal.     Pupils: Pupils are equal, round, and reactive to light.  Cardiovascular:     Rate and Rhythm: Normal rate and regular rhythm.     Heart sounds: Normal heart sounds. No murmur heard.   Pulmonary:     Effort: Pulmonary effort is normal. No respiratory distress.     Breath sounds: Normal breath sounds. No stridor. No wheezing, rhonchi or rales.  Chest:     Chest wall: Mass present. No tenderness.    Abdominal:     General: Abdomen is flat. Bowel sounds are normal. There is no distension  or abdominal bruit. There are no signs of injury.     Palpations: Abdomen is soft. There is no shifting dullness, fluid wave, hepatomegaly, splenomegaly, mass or pulsatile mass.     Tenderness: There is abdominal tenderness in the epigastric area.     Hernia: No hernia is present.  Musculoskeletal:     Cervical back: Neck supple.  Skin:    General: Skin is warm and dry.     Capillary Refill: Capillary refill takes less than 2 seconds.  Neurological:     General: No focal deficit present.     Mental Status: He is alert and oriented to person, place, and time.  Psychiatric:        Mood and Affect: Mood is anxious.        Behavior: Behavior normal.      UC Treatments / Results  Labs (all labs ordered are listed, but only abnormal results are displayed) Labs Reviewed - No data to display  EKG   Radiology No results found.  Procedures Procedures (including critical care time)  Medications Ordered in UC Medications - No data to display  Initial Impression / Assessment and Plan / UC Course  I have reviewed the triage vital signs and the nursing notes.  Pertinent labs & imaging results that were available during my care of the patient were reviewed by me and considered in my medical decision making (see chart for details).    GERD Epigastric Pain Nodule of chest wall Anxiety  Discussed diet to help improve GERD symptoms Prescribed omeprazole daily Avoid alcohol and allow the esophagus to heal Discussed when to seek emergent care for chest pain (SOB, fatigue, palpitations, radiating pain, chest pressure/pain that is not relieved by rest, etc) Discussed that he needs to get established with PCP for further workup of the chest nodule, may need [place] Discussed that is likely a fibroma, but cannot confirm on exam alone Also discussed that he needs PCP to help manage anxiety as well Work note provided Follow up with this office or PCP as needed Follow up with the ER for acute  worsening symptoms, trouble swallowing, trouble breathing, other concerning symptoms  Final Clinical Impressions(s) / UC Diagnoses   Final diagnoses:  Epigastric pain  Gastroesophageal reflux disease with esophagitis without hemorrhage  Nodule of chest wall  Generalized anxiety disorder     Discharge Instructions     I have sent in omeprazole for you to take once daily to help with your reflux symptoms.  Drink plenty of water. Avoid alcohol for the next few days as well to allow your esophagus time to heal  Follow up with PCP for anxiety management  Follow up with this office or with primary care if symptoms are persisting.  Follow up in the ER for high fever, trouble swallowing, trouble breathing, other concerning symptoms.     ED Prescriptions    Medication Sig Dispense Auth. Provider   omeprazole (PRILOSEC) 20 MG capsule Take 1 capsule (20 mg total) by mouth daily. 30 capsule Moshe Cipro, NP     PDMP not reviewed this encounter.   Moshe Cipro, NP 09/16/20 1937    Moshe Cipro, NP 09/16/20 [phone]

## 2020-09-16 NOTE — ED Triage Notes (Signed)
Epigastric pain x 2 weeks, has GERD but stopped taking the meds a least a year ago. Went to [employer] and drank a lot, has been sick throwing up and having pain since.

## 2020-09-16 NOTE — Discharge Instructions (Signed)
I have sent in omeprazole for you to take once daily to help with your reflux symptoms.  Drink plenty of water. Avoid alcohol for the next few days as well to allow your esophagus time to heal  Follow up with PCP for anxiety management  Follow up with this office or with primary care if symptoms are persisting.  Follow up in the ER for high fever, trouble swallowing, trouble breathing, other concerning symptoms.

## 2020-11-07 ENCOUNTER — Other Ambulatory Visit: Payer: Self-pay

## 2020-11-07 ENCOUNTER — Emergency Department (INDEPENDENT_AMBULATORY_CARE_PROVIDER_SITE_OTHER): Payer: BC Managed Care – PPO

## 2020-11-07 ENCOUNTER — Emergency Department (INDEPENDENT_AMBULATORY_CARE_PROVIDER_SITE_OTHER)
Admission: EM | Admit: 2020-11-07 | Discharge: 2020-11-07 | Disposition: A | Discharge status: Home / Self Care | Payer: BC Managed Care – PPO | Source: Home / Self Care

## 2020-11-07 DIAGNOSIS — S99921A Unspecified injury of right foot, initial encounter: Secondary | ICD-10-CM | POA: Diagnosis not present

## 2020-11-07 DIAGNOSIS — S9031XA Contusion of right foot, initial encounter: Secondary | ICD-10-CM

## 2020-11-07 DIAGNOSIS — S99911A Unspecified injury of right ankle, initial encounter: Secondary | ICD-10-CM

## 2020-11-07 DIAGNOSIS — M79671 Pain in right foot: Secondary | ICD-10-CM

## 2020-11-07 DIAGNOSIS — S93401A Sprain of unspecified ligament of right ankle, initial encounter: Secondary | ICD-10-CM

## 2020-11-07 DIAGNOSIS — M25571 Pain in right ankle and joints of right foot: Secondary | ICD-10-CM

## 2020-11-07 HISTORY — DX: Gastro-esophageal reflux disease without esophagitis: K21.9

## 2020-11-07 NOTE — ED Triage Notes (Addendum)
Pt presents to Urgent Care with c/o R lateral foot/ankle pain following injury yesterday. Reports his foot entered a hole in the yard and he twisted it and fell, feeling a "pop" in the area. Swelling and bruising noted to R lateral foot. Reports that toes on R foot are numb and limited mobility of toes. Pedal pulse palpable and foot is warm to touch.

## 2020-11-07 NOTE — ED Provider Notes (Signed)
[facility]    CSN: [id number] Arrival date & time: 11/07/20  1041      History   Chief Complaint Chief Complaint  Patient presents with  . Foot Injury    Right    HPI Corey Peters is a 25 y.o. male.   HPI   25 year old male presents with right lateral foot/ankle pain following injury yesterday.  Patient reports accidentally stepping in hole in yard twisting right foot and right ankle hearing a pop.  Reports swelling and bruising noted right lateral foot reports toes on right foot are numb and has limited mobility of right toes.  Past Medical History:  Diagnosis Date  . Anxiety   . COVID-19 08/2019  . GERD (gastroesophageal reflux disease)     There are no problems to display for this patient.   History reviewed. No pertinent surgical history.     Home Medications    Prior to Admission medications   Medication Sig Start Date End Date Taking? Authorizing Provider  omeprazole (PRILOSEC) 20 MG capsule Take 1 capsule (20 mg total) by mouth daily. 09/16/20   Moshe Cipro, NP    Family History Family History  Problem Relation Age of Onset  . Asthma Mother   . Healthy Father   . Healthy Sister     Social History Social History   Tobacco Use  . Smoking status: Former Smoker    Packs/day: 0.00    Quit date: 2020    Years since quitting: 2.3  . Smokeless tobacco: Never Used  Vaping Use  . Vaping Use: Every day  Substance Use Topics  . Alcohol use: Yes    Comment: on weekends  . Drug use: Not Currently     Allergies   Patient has no known allergies.   Review of Systems Review of Systems  Constitutional: Negative.   HENT: Negative.   Eyes: Negative.   Respiratory: Negative.   Cardiovascular: Negative.   Gastrointestinal: Negative.   Musculoskeletal: Negative.        Right foot/right ankle pain  Skin: Negative.   Neurological: Negative.      Physical Exam Triage Vital Signs ED Triage Vitals  Enc Vitals Group     BP  11/07/20 1211 132/72     Pulse Rate 11/07/20 1211 (!) 53     Resp 11/07/20 1211 20     Temp 11/07/20 1211 97.9 F (36.6 C)     Temp Source 11/07/20 1211 Oral     SpO2 11/07/20 1211 100 %     Weight 11/07/20 1207 160 lb (72.6 kg)     Height 11/07/20 1207 6\' 1"  (1.854 m)     Head Circumference --      Peak Flow --      Pain Score 11/07/20 1207 10     Pain Loc --      Pain Edu? --      Excl. in GC? --    No data found.  Updated Vital Signs BP 132/72 (BP Location: Right Arm)   Pulse (!) 53   Temp 97.9 F (36.6 C) (Oral)   Resp 20   Ht 6\' 1"  (1.854 m)   Wt 160 lb (72.6 kg)   SpO2 100%   BMI 21.11 kg/m   Visual Acuity Right Eye Distance:   Left Eye Distance:   Bilateral Distance:    Right Eye Near:   Left Eye Near:    Bilateral Near:     Physical Exam Vitals and nursing  note reviewed.  Constitutional:      General: He is not in acute distress.    Appearance: Normal appearance. He is not ill-appearing.  HENT:     Head: Normocephalic and atraumatic.  Eyes:     Extraocular Movements: Extraocular movements intact.     Conjunctiva/sclera: Conjunctivae normal.     Pupils: Pupils are equal, round, and reactive to light.  Cardiovascular:     Rate and Rhythm: Regular rhythm. Bradycardia present.     Pulses: Normal pulses.     Heart sounds: Normal heart sounds.  Pulmonary:     Effort: Pulmonary effort is normal.     Breath sounds: Normal breath sounds. No wheezing, rhonchi or rales.  Musculoskeletal:     Comments: Right ankle/right foot: TTP over lateral malleolus and superior lateral aspect of right foot, moderate soft tissue swelling noted, LROM with dorsiflexion/plantar flexion, inversion/eversion  Skin:    General: Skin is warm and dry.  Neurological:     General: No focal deficit present.     Mental Status: He is alert and oriented to person, place, and time.  Psychiatric:        Mood and Affect: Mood normal.        Behavior: Behavior normal.      UC  Treatments / Results  Labs (all labs ordered are listed, but only abnormal results are displayed) Labs Reviewed - No data to display  EKG   Radiology DG Ankle Complete Right  Result Date: 11/07/2020 CLINICAL DATA:  Pain following twisting injury EXAM: RIGHT ANKLE - COMPLETE 3+ VIEW COMPARISON:  None. FINDINGS: Frontal, oblique, and lateral views were obtained. No fracture or joint effusion. No appreciable joint space narrowing or erosion. Ankle mortise appears intact. IMPRESSION: No fracture or arthropathy.  Ankle mortise appears intact. Electronically Signed   By: Bretta Bang III M.D.   On: 11/07/2020 12:55   DG Foot Complete Right  Result Date: 11/07/2020 CLINICAL DATA:  Pain after twisting injury EXAM: RIGHT FOOT COMPLETE - 3+ VIEW COMPARISON:  None. FINDINGS: Frontal, oblique, and lateral views were obtained. There is no appreciable fracture or dislocation. Joint spaces appear normal. No erosive change. IMPRESSION: No fracture or dislocation.  No evident arthropathy. Electronically Signed   By: Bretta Bang III M.D.   On: 11/07/2020 12:54    Procedures Procedures (including critical care time)  Medications Ordered in UC Medications - No data to display  Initial Impression / Assessment and Plan / UC Course  I have reviewed the triage vital signs and the nursing notes.  Pertinent labs & imaging results that were available during my care of the patient were reviewed by me and considered in my medical decision making (see chart for details).    MDM: 1.  Right ankle pain, 2.  Right foot pain, 3.  Right ankle sprain, 4.  Right foot contusion.  Patient placed in right ankle Aircast, provided crutches prior to discharge today.  Discharged home, hemodynamically stable. Final Clinical Impressions(s) / UC Diagnoses   Final diagnoses:  Acute right ankle pain  Right foot pain  Sprain of right ankle, unspecified ligament, initial encounter  Contusion of right foot, initial  encounter     Discharge Instructions     Advised/encouraged patient may use Ibuprofen (800 mg 3 times daily, as needed) for right ankle/right foot pain.  Advised/encouraged patient to RICE right ankle/right foot for 20 minutes 2-3 times daily for the next 3 to 5 days.  Patient work note provided per  his request.    ED Prescriptions    None     PDMP not reviewed this encounter.   Trevor Iha, FNP 11/07/20 1316

## 2020-11-07 NOTE — Discharge Instructions (Addendum)
Advised/encouraged patient may use Ibuprofen (800 mg 3 times daily, as needed) for right ankle/right foot pain.  Advised/encouraged patient to RICE right ankle/right foot for 20 minutes 2-3 times daily for the next 3 to 5 days.  Patient work note provided per his request.

## 2020-12-17 ENCOUNTER — Other Ambulatory Visit: Payer: Self-pay

## 2020-12-17 ENCOUNTER — Emergency Department (INDEPENDENT_AMBULATORY_CARE_PROVIDER_SITE_OTHER)
Admission: EM | Admit: 2020-12-17 | Discharge: 2020-12-17 | Disposition: A | Discharge status: Home / Self Care | Payer: BC Managed Care – PPO | Source: Home / Self Care

## 2020-12-17 DIAGNOSIS — J029 Acute pharyngitis, unspecified: Secondary | ICD-10-CM

## 2020-12-17 LAB — POCT RAPID STREP A (OFFICE): Rapid Strep A Screen: NEGATIVE

## 2020-12-17 NOTE — ED Triage Notes (Signed)
Pt c/o sore throat since Saturday. Denies fever. No hx of strep. No known covid exposure.

## 2020-12-17 NOTE — Discharge Instructions (Signed)
Drink plenty of fluids.  Salt water gargles may help.  Take Tylenol or ibuprofen for pain.  Get plenty of rest.  I would expect improvement over the next couple days.  Consider COVID testing if you fail to improve

## 2020-12-21 LAB — CULTURE, GROUP A STREP

## 2021-01-21 ENCOUNTER — Other Ambulatory Visit: Payer: Self-pay

## 2021-01-21 ENCOUNTER — Emergency Department (INDEPENDENT_AMBULATORY_CARE_PROVIDER_SITE_OTHER)
Admission: EM | Admit: 2021-01-21 | Discharge: 2021-01-21 | Disposition: A | Discharge status: Home / Self Care | Payer: Self-pay | Source: Home / Self Care | Attending: Family Medicine | Admitting: Family Medicine

## 2021-01-21 DIAGNOSIS — J029 Acute pharyngitis, unspecified: Secondary | ICD-10-CM

## 2021-01-21 LAB — POCT RAPID STREP A (OFFICE): Rapid Strep A Screen: NEGATIVE

## 2021-01-21 NOTE — ED Provider Notes (Signed)
[facility]    CSN: [id number] Arrival date & time: 01/21/21  1751      History   Chief Complaint Chief Complaint  Patient presents with   Sore Throat   Headache   Nasal Congestion    HPI Corey Peters is a 25 y.o. male.   HPI  Patient states he has been with his girlfriend for the last 2 or 3 days.  She has been sick with a cold.  Sore throat runny nose and cough.  She did 2 COVID test that were negative.  Patient states he not COVID vaccinated but he has had COVID in the past.  He states yesterday he developed sore throat.  Fever.  Headache.  Some runny nose and body aches.  His COVID test was negative.  He is here for evaluation.  He needs a note for work since he was unable to go in today.  Climbs telephone poles for living.  Past Medical History:  Diagnosis Date   Anxiety    COVID-19 08/2019   GERD (gastroesophageal reflux disease)     There are no problems to display for this patient.   History reviewed. No pertinent surgical history.     Home Medications    Prior to Admission medications   Not on File    Family History Family History  Problem Relation Age of Onset   Asthma Mother    Healthy Father    Healthy Sister     Social History Social History   Tobacco Use   Smoking status: Former    Packs/day: 0.00    Types: Cigarettes    Quit date: 2020    Years since quitting: 2.5   Smokeless tobacco: Never  Vaping Use   Vaping Use: Every day  Substance Use Topics   Alcohol use: Yes    Comment: on weekends   Drug use: Not Currently     Allergies   Patient has no known allergies.   Review of Systems Review of Systems See HPI  Physical Exam Triage Vital Signs ED Triage Vitals  Enc Vitals Group     BP 01/21/21 1828 126/79     Pulse Rate 01/21/21 1828 70     Resp 01/21/21 1828 18     Temp 01/21/21 1828 98.5 F (36.9 C)     Temp Source 01/21/21 1828 Oral     SpO2 01/21/21 1828 98 %     Weight --      Height --       Head Circumference --      Peak Flow --      Pain Score 01/21/21 1829 6     Pain Loc --      Pain Edu? --      Excl. in GC? --    No data found.  Updated Vital Signs BP 126/79 (BP Location: Right Arm)   Pulse 70   Temp 98.5 F (36.9 C) (Oral)   Resp 18   SpO2 98%      Physical Exam Constitutional:      General: He is not in acute distress.    Appearance: Normal appearance. He is well-developed.  HENT:     Head: Normocephalic and atraumatic.     Nose: Congestion present.     Mouth/Throat:     Pharynx: Posterior oropharyngeal erythema present.  Eyes:     Conjunctiva/sclera: Conjunctivae normal.     Pupils: Pupils are equal, round, and reactive to light.  Cardiovascular:  Rate and Rhythm: Normal rate.  Pulmonary:     Effort: Pulmonary effort is normal. No respiratory distress.  Abdominal:     General: There is no distension.     Palpations: Abdomen is soft.  Musculoskeletal:        General: Normal range of motion.     Cervical back: Normal range of motion.  Lymphadenopathy:     Cervical: No cervical adenopathy.  Skin:    General: Skin is warm and dry.  Neurological:     Mental Status: He is alert.  Psychiatric:        Mood and Affect: Mood normal.        Behavior: Behavior normal.     UC Treatments / Results  Labs (all labs ordered are listed, but only abnormal results are displayed) Labs Reviewed  POCT RAPID STREP A (OFFICE)    EKG   Radiology No results found.  Procedures Procedures (including critical care time)  Medications Ordered in UC Medications - No data to display  Initial Impression / Assessment and Plan / UC Course  I have reviewed the triage vital signs and the nursing notes.  Pertinent labs & imaging results that were available during my care of the patient were reviewed by me and considered in my medical decision making (see chart for details).     Viral pharyngitis, not strep, not COVID.  Home to rest and symptomatic  care Final Clinical Impressions(s) / UC Diagnoses   Final diagnoses:  Sore throat     Discharge Instructions      Strep test is negative Viral respiratory infection Rest, push fluids, take Tylenol or ibuprofen for pain.  May use over-the-counter cough and cold medicines as needed Expect improvement few days   ED Prescriptions   None    PDMP not reviewed this encounter.   Eustace Moore, MD 01/21/21 [phone]

## 2021-01-21 NOTE — Discharge Instructions (Signed)
Strep test is negative Viral respiratory infection Rest, push fluids, take Tylenol or ibuprofen for pain.  May use over-the-counter cough and cold medicines as needed Expect improvement few days

## 2021-01-21 NOTE — ED Triage Notes (Signed)
Pt c/o sore throat, runny nose and headache x 2 days. Gf currently sick as well, 3 neg covid tests. No known fever. Tylenol prn.

## 2021-04-15 ENCOUNTER — Emergency Department (INDEPENDENT_AMBULATORY_CARE_PROVIDER_SITE_OTHER)
Admission: EM | Admit: 2021-04-15 | Discharge: 2021-04-15 | Disposition: A | Discharge status: Home / Self Care | Payer: Self-pay | Source: Home / Self Care | Attending: Family Medicine | Admitting: Family Medicine

## 2021-04-15 DIAGNOSIS — K047 Periapical abscess without sinus: Secondary | ICD-10-CM

## 2021-04-15 DIAGNOSIS — R220 Localized swelling, mass and lump, head: Secondary | ICD-10-CM

## 2021-04-15 MED ORDER — HYDROCODONE-ACETAMINOPHEN 7.5-325 MG PO TABS: 1.0000 | ORAL_TABLET | Freq: Four times a day (QID) | ORAL | 0 refills | Status: DC | PRN

## 2021-04-15 MED ORDER — AMOXICILLIN-POT CLAVULANATE 875-125 MG PO TABS: 1.0000 | ORAL_TABLET | Freq: Two times a day (BID) | ORAL | 0 refills | Status: DC

## 2021-04-15 NOTE — Discharge Instructions (Signed)
Use ice to the area to try to reduce swelling Take the antibiotic 2 times a day as directed.  Take 2 doses today, 1 now and then 1 at bedtime Take pain medication as needed See a dentist no later than next week

## 2021-04-15 NOTE — ED Provider Notes (Signed)
[facility]    CSN: [id number] Arrival date & time: 04/15/21  1440      History   Chief Complaint Chief Complaint  Patient presents with   Facial Swelling    RT side    HPI Corey Peters is a 25 y.o. male.   HPI  Patient states that he has had some dental problems, but nothing major.  Since yesterday he has had increased pain, swelling in his face, pain with chewing or biting.  Here for evaluation.  Past Medical History:  Diagnosis Date   Anxiety    COVID-19 08/2019   GERD (gastroesophageal reflux disease)     There are no problems to display for this patient.   History reviewed. No pertinent surgical history.     Home Medications    Prior to Admission medications   Medication Sig Start Date End Date Taking? Authorizing Provider  amoxicillin-clavulanate (AUGMENTIN) 875-125 MG tablet Take 1 tablet by mouth every 12 (twelve) hours. 04/15/21  Yes Eustace Moore, MD  HYDROcodone-acetaminophen (NORCO) 7.5-325 MG tablet Take 1 tablet by mouth every 6 (six) hours as needed for moderate pain. 04/15/21  Yes Eustace Moore, MD    Family History Family History  Problem Relation Age of Onset   Asthma Mother    Healthy Father    Healthy Sister     Social History Social History   Tobacco Use   Smoking status: Former    Packs/day: 0.00    Types: Cigarettes    Quit date: 2020    Years since quitting: 2.8   Smokeless tobacco: Never  Vaping Use   Vaping Use: Every day  Substance Use Topics   Alcohol use: Yes    Comment: on weekends   Drug use: Not Currently     Allergies   Patient has no known allergies.   Review of Systems Review of Systems See HPI  Physical Exam Triage Vital Signs ED Triage Vitals  Enc Vitals Group     BP 04/15/21 1531 125/85     Pulse Rate 04/15/21 1531 65     Resp 04/15/21 1531 16     Temp 04/15/21 1531 98.6 F (37 C)     Temp Source 04/15/21 1531 Oral     SpO2 04/15/21 1531 99 %     Weight --       Height --      Head Circumference --      Peak Flow --      Pain Score 04/15/21 1532 10     Pain Loc --      Pain Edu? --      Excl. in GC? --    No data found.  Updated Vital Signs BP 125/85 (BP Location: Left Arm)   Pulse 65   Temp 98.6 F (37 C) (Oral)   Resp 16   SpO2 99%     Physical Exam Constitutional:      General: He is in acute distress.     Appearance: He is well-developed. He is ill-appearing.     Comments: In obvious discomfort  HENT:     Head: Normocephalic and atraumatic.      Right Ear: Tympanic membrane and ear canal normal.     Left Ear: Tympanic membrane and ear canal normal.     Nose: Nose normal.     Mouth/Throat:     Comments: Dental caries and fractures noted.  Erythema and swelling of the gums alongside the right upper  molars Eyes:     Conjunctiva/sclera: Conjunctivae normal.     Pupils: Pupils are equal, round, and reactive to light.  Cardiovascular:     Rate and Rhythm: Normal rate.  Pulmonary:     Effort: Pulmonary effort is normal. No respiratory distress.  Abdominal:     General: There is no distension.     Palpations: Abdomen is soft.  Musculoskeletal:        General: Normal range of motion.     Cervical back: Normal range of motion.  Skin:    General: Skin is warm and dry.  Neurological:     Mental Status: He is alert.     UC Treatments / Results  Labs (all labs ordered are listed, but only abnormal results are displayed) Labs Reviewed - No data to display  EKG   Radiology No results found.  Procedures Procedures (including critical care time)  Medications Ordered in UC Medications - No data to display  Initial Impression / Assessment and Plan / UC Course  I have reviewed the triage vital signs and the nursing notes.  Pertinent labs & imaging results that were available during my care of the patient were reviewed by me and considered in my medical decision making (see chart for details).      Final Clinical  Impressions(s) / UC Diagnoses   Final diagnoses:  Dental infection  Facial swelling     Discharge Instructions      Use ice to the area to try to reduce swelling Take the antibiotic 2 times a day as directed.  Take 2 doses today, 1 now and then 1 at bedtime Take pain medication as needed See a dentist no later than next week   ED Prescriptions     Medication Sig Dispense Auth. Provider   amoxicillin-clavulanate (AUGMENTIN) 875-125 MG tablet Take 1 tablet by mouth every 12 (twelve) hours. 14 tablet Eustace Moore, MD   HYDROcodone-acetaminophen [facility]) 7.5-325 MG tablet Take 1 tablet by mouth every 6 (six) hours as needed for moderate pain. 10 tablet Eustace Moore, MD      I have reviewed the PDMP during this encounter.   Eustace Moore, MD 04/15/21 [phone]

## 2021-04-15 NOTE — ED Triage Notes (Signed)
Pt c/o facial swelling to the RT side of face that started over night. Has had dental pain for awhile, says he bit into something and felt even worse pain. Pain 10/10 tylenol, motrin no relief.

## 2022-08-11 ENCOUNTER — Ambulatory Visit
Admission: EM | Admit: 2022-08-11 | Discharge: 2022-08-11 | Disposition: A | Discharge status: Home / Self Care | Payer: BC Managed Care – PPO | Attending: Urgent Care | Admitting: Urgent Care

## 2022-08-11 DIAGNOSIS — J029 Acute pharyngitis, unspecified: Secondary | ICD-10-CM | POA: Diagnosis not present

## 2022-08-11 DIAGNOSIS — R0789 Other chest pain: Secondary | ICD-10-CM | POA: Diagnosis not present

## 2022-08-11 LAB — POCT RAPID STREP A (OFFICE): Rapid Strep A Screen: NEGATIVE

## 2022-08-11 MED ORDER — ALBUTEROL SULFATE HFA 108 (90 BASE) MCG/ACT IN AERS: 1.0000 | INHALATION_SPRAY | Freq: Four times a day (QID) | RESPIRATORY_TRACT | 0 refills | Status: DC | PRN

## 2022-08-11 MED ORDER — ALBUTEROL SULFATE (2.5 MG/3ML) 0.083% IN NEBU
2.5000 mg | INHALATION_SOLUTION | Freq: Once | RESPIRATORY_TRACT | Status: AC
  Administered 2022-08-11: 2.5 mg via RESPIRATORY_TRACT

## 2022-08-11 NOTE — ED Triage Notes (Signed)
Pt c/o sore throat and shortness of breath that started last night. Slight cough. No known fever. Tylenol prn.

## 2022-08-11 NOTE — Discharge Instructions (Addendum)
Your rapid strep is negative. You were given a breathing treatment in our office with albuterol.  Please read the attached handout for pharyngitis.  Cepacol lozenges or salt water gargles may be helpful  Please use the albuterol inhaler 2 puffs every 4-6 hours to help with the tightness in your chest.  Please establish care with a PCP to further discuss vaping cessation.

## 2022-08-11 NOTE — ED Provider Notes (Signed)
[facility]    CSN: [id number] Arrival date & time: 08/11/22  1323      History   Chief Complaint Chief Complaint  Patient presents with   Sore Throat    HPI Corey Peters is a 27 y.o. male.   27 year old male presents today primarily due to concerns of a sore throat.  He states last night he had slight shortness of breath, cough, but that is seem to improve today.  He was concerned because he felt like his throat was swollen this morning.  He denies a fever.  He denies headache, nasal congestion, sinus pain, or ear pressure.  The cough is dry.  He did take 2 tablets of Tylenol but no other additional medications tried.  He denies any GI symptoms, no rash.  Patient denies any known sick contacts. He does vape. He states that sitting here right now he is not feeling short of breath, but notes that at times he feels like he has to yawn and cannot catch his breath.    Sore Throat Associated symptoms include shortness of breath.    Past Medical History:  Diagnosis Date   Anxiety    COVID-19 08/2019   GERD (gastroesophageal reflux disease)     There are no problems to display for this patient.   History reviewed. No pertinent surgical history.     Home Medications    Prior to Admission medications   Medication Sig Start Date End Date Taking? Authorizing Provider  albuterol (VENTOLIN HFA) 108 (90 Base) MCG/ACT inhaler Inhale 1-2 puffs into the lungs every 6 (six) hours as needed for wheezing or shortness of breath. 08/11/22  Yes Birdella Sippel L, PA  HYDROcodone-acetaminophen (NORCO) 7.5-325 MG tablet Take 1 tablet by mouth every 6 (six) hours as needed for moderate pain. 04/15/21   Raylene Everts, MD    Family History Family History  Problem Relation Age of Onset   Asthma Mother    Healthy Father    Healthy Sister     Social History Social History   Tobacco Use   Smoking status: Former    Packs/day: 0.00    Types: Cigarettes    Quit date:  2020    Years since quitting: 4.1   Smokeless tobacco: Never  Vaping Use   Vaping Use: Every day  Substance Use Topics   Alcohol use: Yes    Comment: on weekends   Drug use: Not Currently     Allergies   Patient has no known allergies.   Review of Systems Review of Systems  HENT:  Positive for sore throat.   Respiratory:  Positive for shortness of breath.   All other systems reviewed and are negative.    Physical Exam Triage Vital Signs ED Triage Vitals  Enc Vitals Group     BP 08/11/22 1332 132/89     Pulse Rate 08/11/22 1332 62     Resp 08/11/22 1332 17     Temp 08/11/22 1332 97.9 F (36.6 C)     Temp Source 08/11/22 1332 Oral     SpO2 08/11/22 1332 98 %     Weight 08/11/22 1333 155 lb (70.3 kg)     Height --      Head Circumference --      Peak Flow --      Pain Score 08/11/22 1333 1     Pain Loc --      Pain Edu? --      Excl. in  GC? --    No data found.  Updated Vital Signs BP 132/89 (BP Location: Right Arm)   Pulse 62   Temp 97.9 F (36.6 C) (Oral)   Resp 17   Wt 155 lb (70.3 kg)   SpO2 98%   BMI 20.45 kg/m   Visual Acuity Right Eye Distance:   Left Eye Distance:   Bilateral Distance:    Right Eye Near:   Left Eye Near:    Bilateral Near:     Physical Exam Vitals and nursing note reviewed.  Constitutional:      General: He is not in acute distress.    Appearance: He is well-developed and normal weight. He is not ill-appearing, toxic-appearing or diaphoretic.  HENT:     Head: Normocephalic and atraumatic.     Right Ear: Tympanic membrane and ear canal normal. No drainage, swelling or tenderness. No middle ear effusion. Tympanic membrane is not erythematous.     Left Ear: Tympanic membrane and ear canal normal. No drainage, swelling or tenderness.  No middle ear effusion. Tympanic membrane is not erythematous.     Nose: No congestion or rhinorrhea.     Mouth/Throat:     Mouth: Mucous membranes are moist. No oral lesions.     Pharynx:  Oropharynx is clear. No pharyngeal swelling, oropharyngeal exudate, posterior oropharyngeal erythema or uvula swelling.     Tonsils: No tonsillar exudate or tonsillar abscesses.  Eyes:     Extraocular Movements:     Left eye: Normal extraocular motion.     Conjunctiva/sclera: Conjunctivae normal.     Pupils: Pupils are equal, round, and reactive to light.  Neck:     Thyroid: No thyromegaly.  Cardiovascular:     Rate and Rhythm: Normal rate.     Heart sounds: Normal heart sounds. No murmur heard.    No friction rub. No gallop.  Pulmonary:     Effort: Pulmonary effort is normal. No respiratory distress.     Breath sounds: No stridor. No wheezing, rhonchi or rales.     Comments: Decreased breath sounds bilaterally, improved s/p neb Chest:     Chest wall: No tenderness.  Abdominal:     Palpations: Abdomen is soft.  Musculoskeletal:     Cervical back: Normal range of motion and neck supple.  Lymphadenopathy:     Cervical: No cervical adenopathy.  Skin:    General: Skin is warm.     Capillary Refill: Capillary refill takes less than 2 seconds.     Coloration: Skin is not pale.     Findings: No erythema or rash.  Neurological:     General: No focal deficit present.     Mental Status: He is alert.  Psychiatric:        Mood and Affect: Mood normal.        Behavior: Behavior normal.      UC Treatments / Results  Labs (all labs ordered are listed, but only abnormal results are displayed) Labs Reviewed  POCT RAPID STREP A (OFFICE)    EKG   Radiology No results found.  Procedures Procedures (including critical care time)  Medications Ordered in UC Medications  albuterol (PROVENTIL) (2.5 MG/3ML) 0.083% nebulizer solution 2.5 mg (2.5 mg Nebulization Given 08/11/22 1409)    Initial Impression / Assessment and Plan / UC Course  I have reviewed the triage vital signs and the nursing notes.  Pertinent labs & imaging results that were available during my care of the patient  were reviewed by  me and considered in my medical decision making (see chart for details).     Viral pharyngitis - rapid strep negative. Pt afebrile, no lymphadenopathy. Does not meet criteria for abx treatment. Supportive measures discussed. Tightness in chest - improved s/p neb. Will DC home with handheld albuterol. We did discuss vaping and dip cessation as he is using both. He is hoping to find a PCP who can further assist.  Final Clinical Impressions(s) / UC Diagnoses   Final diagnoses:  Viral pharyngitis  Tightness in chest     Discharge Instructions      Your rapid strep is negative. You were given a breathing treatment in our office with albuterol.  Please read the attached handout for pharyngitis.  Cepacol lozenges or salt water gargles may be helpful  Please use the albuterol inhaler 2 puffs every 4-6 hours to help with the tightness in your chest.  Please establish care with a PCP to further discuss vaping cessation.      ED Prescriptions     Medication Sig Dispense Auth. Provider   albuterol (VENTOLIN HFA) 108 (90 Base) MCG/ACT inhaler Inhale 1-2 puffs into the lungs every 6 (six) hours as needed for wheezing or shortness of breath. 8 g Manav Pierotti L, [place]      PDMP not reviewed this encounter.   Chaney Malling, [place] 08/11/22 1431

## 2023-07-10 ENCOUNTER — Ambulatory Visit
Admission: EM | Admit: 2023-07-10 | Discharge: 2023-07-10 | Disposition: A | Discharge status: Home / Self Care | Payer: Self-pay | Attending: Family Medicine | Admitting: Family Medicine

## 2023-07-10 ENCOUNTER — Other Ambulatory Visit: Payer: Self-pay

## 2023-07-10 DIAGNOSIS — K029 Dental caries, unspecified: Secondary | ICD-10-CM

## 2023-07-10 DIAGNOSIS — K047 Periapical abscess without sinus: Secondary | ICD-10-CM

## 2023-07-10 MED ORDER — TRAMADOL HCL 50 MG PO TABS: 50.0000 mg | ORAL_TABLET | Freq: Four times a day (QID) | ORAL | 0 refills | Status: AC | PRN

## 2023-07-10 MED ORDER — AMOXICILLIN 875 MG PO TABS: 875.0000 mg | ORAL_TABLET | Freq: Two times a day (BID) | ORAL | 0 refills | Status: DC

## 2023-07-10 NOTE — Discharge Instructions (Signed)
Try to get into see a dentist  Take the antibiotic 2 times a day as directed Take tramadol if needed for pain

## 2023-07-10 NOTE — ED Triage Notes (Signed)
Left lower dental pain x couple of days. No fever. Has taken ibuprofen which has not helped.

## 2023-07-11 NOTE — ED Provider Notes (Signed)
[facility]    CSN: [id number] Arrival date & time: 07/10/23  1448      History   Chief Complaint Chief Complaint  Patient presents with   Dental Pain    HPI Corey Peters is a 28 y.o. male.   HPI  I have seen this patient previously for dental pain and dental infection.  It was recommended that he follow-up with a dentist.  Unfortunately, the patient feels he cannot afford dental care.  He does not have insurance.  He is here today with left lower molar pain, redness, suspected infection.  Not relieved with ibuprofen and Tylenol.  Past Medical History:  Diagnosis Date   Anxiety    COVID-19 08/2019   GERD (gastroesophageal reflux disease)     There are no active problems to display for this patient.   History reviewed. No pertinent surgical history.     Home Medications    Prior to Admission medications   Medication Sig Start Date End Date Taking? Authorizing Provider  amoxicillin (AMOXIL) 875 MG tablet Take 1 tablet (875 mg total) by mouth 2 (two) times daily. 07/10/23  Yes Eustace Moore, MD  traMADol (ULTRAM) 50 MG tablet Take 1 tablet (50 mg total) by mouth every 6 (six) hours as needed. 07/10/23  Yes Eustace Moore, MD    Family History Family History  Problem Relation Age of Onset   Asthma Mother    Healthy Father    Healthy Sister     Social History Social History   Tobacco Use   Smoking status: Former    Current packs/day: 0.00    Types: Cigarettes    Quit date: 2020    Years since quitting: 5.0   Smokeless tobacco: Never  Vaping Use   Vaping status: Every Day  Substance Use Topics   Alcohol use: Yes    Comment: on weekends   Drug use: Not Currently     Allergies   Patient has no known allergies.   Review of Systems Review of Systems See HPI  Physical Exam Triage Vital Signs ED Triage Vitals  Encounter Vitals Group     BP 07/10/23 1513 129/80     Systolic BP Percentile --      Diastolic BP Percentile --       Pulse Rate 07/10/23 1513 60     Resp 07/10/23 1513 16     Temp 07/10/23 1513 98 F (36.7 C)     Temp Source 07/10/23 1513 Oral     SpO2 07/10/23 1513 99 %     Weight --      Height --      Head Circumference --      Peak Flow --      Pain Score 07/10/23 1525 10     Pain Loc --      Pain Education --      Exclude from Growth Chart --    No data found.  Updated Vital Signs BP 129/80   Pulse 60   Temp 98 F (36.7 C) (Oral)   Resp 16   SpO2 99%      Physical Exam Constitutional:      General: He is not in acute distress.    Appearance: He is well-developed and normal weight.  HENT:     Head: Normocephalic and atraumatic.     Mouth/Throat:     Comments: Poor dentition.  Fractures and caries.  Periodontal disease.  The left posterior molar has  swelling of the adjacent gums and evidence of infection, possible abscess Eyes:     Conjunctiva/sclera: Conjunctivae normal.     Pupils: Pupils are equal, round, and reactive to light.  Cardiovascular:     Rate and Rhythm: Normal rate.  Pulmonary:     Effort: Pulmonary effort is normal. No respiratory distress.  Abdominal:     General: There is no distension.     Palpations: Abdomen is soft.  Musculoskeletal:        General: Normal range of motion.     Cervical back: Normal range of motion.  Skin:    General: Skin is warm and dry.  Neurological:     Mental Status: He is alert.      UC Treatments / Results  Labs (all labs ordered are listed, but only abnormal results are displayed) Labs Reviewed - No data to display  EKG   Radiology No results found.  Procedures Procedures (including critical care time)  Medications Ordered in UC Medications - No data to display  Initial Impression / Assessment and Plan / UC Course  I have reviewed the triage vital signs and the nursing notes.  Pertinent labs & imaging results that were available during my care of the patient were reviewed by me and considered in my  medical decision making (see chart for details).     Final Clinical Impressions(s) / UC Diagnoses   Final diagnoses:  Dental caries  Dental infection     Discharge Instructions      Try to get into see a dentist  Take the antibiotic 2 times a day as directed Take tramadol if needed for pain   ED Prescriptions     Medication Sig Dispense Auth. Provider   amoxicillin (AMOXIL) 875 MG tablet Take 1 tablet (875 mg total) by mouth 2 (two) times daily. 14 tablet Eustace Moore, MD   traMADol (ULTRAM) 50 MG tablet Take 1 tablet (50 mg total) by mouth every 6 (six) hours as needed. 15 tablet Eustace Moore, MD      I have reviewed the PDMP during this encounter.   Eustace Moore, MD 07/11/23 [phone]

## 2023-07-12 ENCOUNTER — Other Ambulatory Visit: Payer: Self-pay

## 2023-07-12 ENCOUNTER — Ambulatory Visit
Admission: EM | Admit: 2023-07-12 | Discharge: 2023-07-12 | Disposition: A | Discharge status: Home / Self Care | Payer: Medicaid Other | Attending: Physician Assistant | Admitting: Physician Assistant

## 2023-07-12 DIAGNOSIS — K0889 Other specified disorders of teeth and supporting structures: Secondary | ICD-10-CM

## 2023-07-12 DIAGNOSIS — K047 Periapical abscess without sinus: Secondary | ICD-10-CM

## 2023-07-12 DIAGNOSIS — K029 Dental caries, unspecified: Secondary | ICD-10-CM

## 2023-07-12 MED ORDER — LIDOCAINE VISCOUS HCL 2 % MT SOLN: 15.0000 mL | Freq: Four times a day (QID) | OROMUCOSAL | 0 refills | Status: AC | PRN

## 2023-07-12 MED ORDER — CLINDAMYCIN HCL 300 MG PO CAPS: 300.0000 mg | ORAL_CAPSULE | Freq: Three times a day (TID) | ORAL | 0 refills | Status: AC

## 2023-07-12 NOTE — ED Triage Notes (Signed)
Patient presents to [facility] for dental pain. Seen 01/18. States he has a dental appt Thursday and unable to eat due to pain. He was prescribed tramadol with no relief. He has also taken ibuprofen, tylenol with no relief.

## 2023-07-12 NOTE — ED Provider Notes (Signed)
[facility]    CSN: [id number] Arrival date & time: 07/12/23  1643      History   Chief Complaint Chief Complaint  Patient presents with   Dental Pain    HPI Corey Peters is a 28 y.o. male.   Patient presents today with a several day history of left lower tooth pain.  He was seen by our clinic on 07/10/2023 at which point he was started on Augmentin and given tramadol for pain relief.  He has a history of a broken tooth in this area that often becomes infected and has been treated several times with antibiotics.  He does have a dentist appointment on Thursday but has not seen a dentist recently denies any recent dental procedures.  He reports pain is rated 10 on a 0-10 pain scale, described as throbbing, no aggravating relieving factors identified.  He is able to eat and drink despite symptoms.  He is also tried over-the-counter Tylenol and ibuprofen without improvement of symptoms.  Denies any swelling of his throat, shortness of breath, muffled voice, dysphagia.    Past Medical History:  Diagnosis Date   Anxiety    COVID-19 08/2019   GERD (gastroesophageal reflux disease)     There are no active problems to display for this patient.   History reviewed. No pertinent surgical history.     Home Medications    Prior to Admission medications   Medication Sig Start Date End Date Taking? Authorizing Provider  clindamycin (CLEOCIN) 300 MG capsule Take 1 capsule (300 mg total) by mouth 3 (three) times daily for 7 days. 07/12/23 07/19/23 Yes Sadye Kiernan K, PA-C  lidocaine (XYLOCAINE) 2 % solution Use as directed 15 mLs in the mouth or throat every 6 (six) hours as needed for mouth pain. 07/12/23  Yes Gari Hartsell K, PA-C  traMADol (ULTRAM) 50 MG tablet Take 1 tablet (50 mg total) by mouth every 6 (six) hours as needed. 07/10/23   Eustace Moore, MD    Family History Family History  Problem Relation Age of Onset   Asthma Mother    Healthy Father    Healthy  Sister     Social History Social History   Tobacco Use   Smoking status: Former    Current packs/day: 0.00    Types: Cigarettes    Quit date: 2020    Years since quitting: 5.0   Smokeless tobacco: Never  Vaping Use   Vaping status: Every Day  Substance Use Topics   Alcohol use: Yes    Comment: on weekends   Drug use: Not Currently     Allergies   Patient has no known allergies.   Review of Systems Review of Systems  Constitutional:  Negative for activity change, appetite change, fatigue and fever.  HENT:  Positive for dental problem. Negative for trouble swallowing and voice change.   Respiratory:  Negative for shortness of breath.   Cardiovascular:  Negative for chest pain.  Gastrointestinal:  Negative for abdominal pain, diarrhea, nausea and vomiting.     Physical Exam Triage Vital Signs ED Triage Vitals [07/12/23 1658]  Encounter Vitals Group     BP 126/84     Systolic BP Percentile      Diastolic BP Percentile      Pulse Rate (!) 56     Resp 18     Temp 97.7 F (36.5 C)     Temp Source Oral     SpO2 96 %  Weight      Height      Head Circumference      Peak Flow      Pain Score      Pain Loc      Pain Education      Exclude from Growth Chart    No data found.  Updated Vital Signs BP 126/84 (BP Location: Left Arm)   Pulse (!) 56   Temp 97.7 F (36.5 C) (Oral)   Resp 18   SpO2 96%   Visual Acuity Right Eye Distance:   Left Eye Distance:   Bilateral Distance:    Right Eye Near:   Left Eye Near:    Bilateral Near:     Physical Exam Vitals reviewed.  Constitutional:      General: He is awake.     Appearance: Normal appearance. He is well-developed. He is not ill-appearing.     Comments: Very pleasant male appears stated age in no acute distress sitting comfortably in exam room  HENT:     Head: Normocephalic and atraumatic.     Right Ear: External ear normal.     Left Ear: External ear normal.     Nose: Nose normal.      Mouth/Throat:     Dentition: Dental tenderness, gingival swelling and dental caries present.     Pharynx: Uvula midline. No oropharyngeal exudate or uvula swelling.      Comments: Fracture #17 with associated caries and surrounding gingival swelling.  No localized fluctuance.  No evidence of Ludwig angina. Cardiovascular:     Rate and Rhythm: Normal rate and regular rhythm.     Heart sounds: No murmur heard. Pulmonary:     Effort: Pulmonary effort is normal. No accessory muscle usage or respiratory distress.     Breath sounds: Normal breath sounds. No stridor. No wheezing, rhonchi or rales.  Neurological:     Mental Status: He is alert.  Psychiatric:        Behavior: Behavior is cooperative.      UC Treatments / Results  Labs (all labs ordered are listed, but only abnormal results are displayed) Labs Reviewed - No data to display  EKG   Radiology No results found.  Procedures Procedures (including critical care time)  Medications Ordered in UC Medications - No data to display  Initial Impression / Assessment and Plan / UC Course  I have reviewed the triage vital signs and the nursing notes.  Pertinent labs & imaging results that were available during my care of the patient were reviewed by me and considered in my medical decision making (see chart for details).     Patient is well-appearing, afebrile, nontoxic, nontachycardic.  Given his ongoing severe pain without improvement despite appropriate antibiotic use we will discontinue Augmentin in place of clindamycin in the hopes of adequate management of infection.  We discussed that he should take this with food to prevent GI upset if he develops any severe diarrhea he is to stop the medication to be seen immediately.  He was encouraged to continue over-the-counter analgesics including Tylenol ibuprofen and can continue the previously prescribed tramadol.  He can gargle with warm salt water.  He was given viscous lidocaine  to help manage pain and we discussed that he should not eat or drink immediately after using this medication as it increases the risk of choking.  He has a dental appointment scheduled in several days and strongly encouraged to keep this appointment as he would likely have  recurrent symptoms until underlying tooth is addressed.  We discussed that if he has any worsening or changing symptoms including high fever, nausea, vomiting, swelling of his throat, shortness of breath, muffled voice he needs to be seen immediately.  Strict return precautions given.  Work excuse note provided.   Final Clinical Impressions(s) / UC Diagnoses   Final diagnoses:  Dental infection  Infected dental caries  Pain, dental     Discharge Instructions      Stop previous antibiotic.  Start clindamycin 3 times daily.  This can upset your stomach to take with food.  If you develop any severe diarrhea stop the medication to be seen immediately.  Continue Tylenol and ibuprofen.  You can continue tramadol as previously prescribed.  I recommend gargle with warm salt water and using viscous lidocaine to help with your symptoms.  Do not eat or drink immediately after the viscous lidocaine as it can increase your risk of choking.  Follow-up with dentist as scheduled.  If anything worsens go to the ER.     ED Prescriptions     Medication Sig Dispense Auth. Provider   clindamycin (CLEOCIN) 300 MG capsule Take 1 capsule (300 mg total) by mouth 3 (three) times daily for 7 days. 21 capsule Rohit Deloria K, PA-C   lidocaine (XYLOCAINE) 2 % solution Use as directed 15 mLs in the mouth or throat every 6 (six) hours as needed for mouth pain. 100 mL Maison Agrusa K, PA-C      PDMP not reviewed this encounter.   Jeani Hawking, PA-C 07/12/23 1753

## 2023-07-12 NOTE — Discharge Instructions (Signed)
Stop previous antibiotic.  Start clindamycin 3 times daily.  This can upset your stomach to take with food.  If you develop any severe diarrhea stop the medication to be seen immediately.  Continue Tylenol and ibuprofen.  You can continue tramadol as previously prescribed.  I recommend gargle with warm salt water and using viscous lidocaine to help with your symptoms.  Do not eat or drink immediately after the viscous lidocaine as it can increase your risk of choking.  Follow-up with dentist as scheduled.  If anything worsens go to the ER.
